# Patient Record
Sex: Female | Born: 1990
Health system: Southern US, Community
[De-identification: ages and names within clinical notes are randomized; demographics above are authoritative.]

## PROBLEM LIST (undated history)

## (undated) ENCOUNTER — Inpatient Hospital Stay (HOSPITAL_COMMUNITY): Payer: Self-pay

## (undated) DIAGNOSIS — N83209 Unspecified ovarian cyst, unspecified side: Secondary | ICD-10-CM

## (undated) DIAGNOSIS — F419 Anxiety disorder, unspecified: Secondary | ICD-10-CM

## (undated) DIAGNOSIS — J45909 Unspecified asthma, uncomplicated: Secondary | ICD-10-CM

## (undated) DIAGNOSIS — F329 Major depressive disorder, single episode, unspecified: Secondary | ICD-10-CM

## (undated) DIAGNOSIS — F32A Depression, unspecified: Secondary | ICD-10-CM

## (undated) HISTORY — DX: Unspecified asthma, uncomplicated: J45.909

## (undated) HISTORY — PX: NO PAST SURGERIES: SHX2092

---

## 2018-02-23 ENCOUNTER — Other Ambulatory Visit: Payer: Self-pay

## 2018-02-23 ENCOUNTER — Encounter (HOSPITAL_BASED_OUTPATIENT_CLINIC_OR_DEPARTMENT_OTHER): Payer: Self-pay | Admitting: *Deleted

## 2018-02-23 ENCOUNTER — Emergency Department (HOSPITAL_BASED_OUTPATIENT_CLINIC_OR_DEPARTMENT_OTHER)
Admission: EM | Admit: 2018-02-23 | Discharge: 2018-02-23 | Disposition: A | Discharge status: Home / Self Care | Payer: Commercial Managed Care - PPO | Attending: Emergency Medicine | Admitting: Emergency Medicine

## 2018-02-23 DIAGNOSIS — N760 Acute vaginitis: Secondary | ICD-10-CM | POA: Insufficient documentation

## 2018-02-23 DIAGNOSIS — B9689 Other specified bacterial agents as the cause of diseases classified elsewhere: Secondary | ICD-10-CM | POA: Diagnosis not present

## 2018-02-23 DIAGNOSIS — R1032 Left lower quadrant pain: Secondary | ICD-10-CM

## 2018-02-23 HISTORY — DX: Unspecified ovarian cyst, unspecified side: N83.209

## 2018-02-23 LAB — URINALYSIS, ROUTINE W REFLEX MICROSCOPIC
BILIRUBIN URINE: NEGATIVE
GLUCOSE, UA: NEGATIVE mg/dL
Hgb urine dipstick: NEGATIVE
Ketones, ur: NEGATIVE mg/dL
LEUKOCYTES UA: NEGATIVE
NITRITE: NEGATIVE
PH: 6.5 (ref 5.0–8.0)
PROTEIN: NEGATIVE mg/dL
Specific Gravity, Urine: 1.025 (ref 1.005–1.030)

## 2018-02-23 LAB — CBC WITH DIFFERENTIAL/PLATELET
BASOS ABS: 0 10*3/uL (ref 0.0–0.1)
BASOS PCT: 1 %
Eosinophils Absolute: 0.3 10*3/uL (ref 0.0–0.7)
Eosinophils Relative: 6 %
HCT: 37.5 % (ref 36.0–46.0)
HEMOGLOBIN: 12.8 g/dL (ref 12.0–15.0)
LYMPHS PCT: 45 %
Lymphs Abs: 2.1 10*3/uL (ref 0.7–4.0)
MCH: 33.3 pg (ref 26.0–34.0)
MCHC: 34.1 g/dL (ref 30.0–36.0)
MCV: 97.7 fL (ref 78.0–100.0)
Monocytes Absolute: 0.5 10*3/uL (ref 0.1–1.0)
Monocytes Relative: 12 %
NEUTROS ABS: 1.7 10*3/uL (ref 1.7–7.7)
NEUTROS PCT: 36 %
Platelets: 255 10*3/uL (ref 150–400)
RBC: 3.84 MIL/uL — AB (ref 3.87–5.11)
RDW: 11.9 % (ref 11.5–15.5)
WBC: 4.6 10*3/uL (ref 4.0–10.5)

## 2018-02-23 LAB — LIPASE, BLOOD: Lipase: 30 U/L (ref 11–51)

## 2018-02-23 LAB — WET PREP, GENITAL
SPERM: NONE SEEN
TRICH WET PREP: NONE SEEN
YEAST WET PREP: NONE SEEN

## 2018-02-23 LAB — COMPREHENSIVE METABOLIC PANEL
ALK PHOS: 54 U/L (ref 38–126)
ALT: 16 U/L (ref 14–54)
AST: 19 U/L (ref 15–41)
Albumin: 4.4 g/dL (ref 3.5–5.0)
Anion gap: 6 (ref 5–15)
BILIRUBIN TOTAL: 0.2 mg/dL — AB (ref 0.3–1.2)
BUN: 11 mg/dL (ref 6–20)
CALCIUM: 9.5 mg/dL (ref 8.9–10.3)
CO2: 26 mmol/L (ref 22–32)
Chloride: 106 mmol/L (ref 101–111)
Creatinine, Ser: 0.6 mg/dL (ref 0.44–1.00)
GFR calc non Af Amer: >60 mL/min (ref >60)
Glucose, Bld: 88 mg/dL (ref 65–99)
POTASSIUM: 4.1 mmol/L (ref 3.5–5.1)
Sodium: 138 mmol/L (ref 135–145)
TOTAL PROTEIN: 6.9 g/dL (ref 6.5–8.1)

## 2018-02-23 LAB — PREGNANCY, URINE: Preg Test, Ur: NEGATIVE

## 2018-02-23 MED ORDER — METRONIDAZOLE 500 MG PO TABS: 500.0000 mg | ORAL_TABLET | Freq: Two times a day (BID) | ORAL | 0 refills | Status: DC

## 2018-02-23 MED ORDER — KETOROLAC TROMETHAMINE 30 MG/ML IJ SOLN
30.0000 mg | Freq: Once | INTRAMUSCULAR | Status: AC
  Administered 2018-02-23: 30 mg via INTRAMUSCULAR
  Filled 2018-02-23: qty 1

## 2018-02-23 MED FILL — metroNIDAZOLE 500 MG TABS: 500 | 7 days supply | Qty: 14 | Fill #0

## 2018-02-23 NOTE — ED Provider Notes (Addendum)
[facility] EMERGENCY DEPARTMENT Provider Note   CSN: [id number] Arrival date & time: 02/23/18  1356     History   Chief Complaint Chief Complaint  Patient presents with  . Flank Pain    HPI Katrina Kennedy is a 27 y.o. female with past medical history ovarian cysts, presenting to the ED with left abdominal pain times 2 days.  Patient states symptoms feel similar to history of ovarian cysts, however pain radiates a little higher into her abdomen than normal.  Reports pain is sharp and worse with particular movement and palpation.  She states she has history of cysts bilaterally.  Takes over-the-counter naproxen for symptoms.  Denies associated urinary symptoms, abnormal vaginal bleeding or discharge, nausea, vomiting, diarrhea, constipation.  No history of abdominal surgery.  States she was last sexually active about 2 months ago without protection.  LMP 02/02/2018.  The history is provided by the patient.    Past Medical History:  Diagnosis Date  . Ovarian cyst     There are no active problems to display for this patient.   History reviewed. No pertinent surgical history.   OB History   None      Home Medications    Prior to Admission medications   Medication Sig Start Date End Date Taking? Authorizing Provider  metroNIDAZOLE (FLAGYL) 500 MG tablet Take 1 tablet (500 mg total) by mouth 2 (two) times daily. 02/23/18   Kaelyn Nauta, [place] N, PA-C    Family History History reviewed. No pertinent family history.  Social History Social History   Tobacco Use  . Smoking status: Never Smoker  . Smokeless tobacco: Never Used  Substance Use Topics  . Alcohol use: Not Currently    Frequency: Never  . Drug use: Never     Allergies   Augmentin [amoxicillin-pot clavulanate]   Review of Systems Review of Systems  Constitutional: Negative for fever.  Gastrointestinal: Positive for abdominal pain. Negative for constipation, diarrhea, nausea and vomiting.    Genitourinary: Negative for dysuria, flank pain, frequency, hematuria, vaginal bleeding and vaginal discharge.  All other systems reviewed and are negative.    Physical Exam Updated Vital Signs BP (!) 110/59   Pulse 73   Temp 97.7 F (36.5 C)   Resp 18   Ht 5\' 3"  (1.6 m)   Wt 56.2 kg (124 lb)   LMP 02/02/2018   SpO2 100%   BMI 21.97 kg/m   Physical Exam  Constitutional: She appears well-developed and well-nourished. No distress.  HENT:  Head: Normocephalic and atraumatic.  Mouth/Throat: Oropharynx is clear and moist.  Eyes: Conjunctivae are normal.  Cardiovascular: Normal rate, regular rhythm, normal heart sounds and intact distal pulses.  Pulmonary/Chest: Effort normal and breath sounds normal. No respiratory distress.  Abdominal: Soft. Bowel sounds are normal. She exhibits no distension and no mass. There is tenderness in the suprapubic area, left upper quadrant and left lower quadrant. There is no rigidity, no rebound, no guarding and no CVA tenderness. No hernia.  Genitourinary: There is no rash or tenderness on the right labia. There is no rash or tenderness on the left labia. Cervix exhibits discharge. Cervix exhibits no motion tenderness. Right adnexum displays no mass, no tenderness and no fullness. Left adnexum displays no mass, no tenderness and no fullness. There is erythema in the vagina. No tenderness or bleeding in the vagina.  Genitourinary Comments: Exam performed with female chaperone present. White malodorous discharge with some vaginal erythema  Neurological: She is alert.  Skin: Skin  is warm.  Psychiatric: She has a normal mood and affect. Her behavior is normal.  Nursing note and vitals reviewed.    ED Treatments / Results  Labs (all labs ordered are listed, but only abnormal results are displayed) Labs Reviewed  WET PREP, GENITAL - Abnormal; Notable for the following components:      Result Value   Clue Cells Wet Prep HPF POC PRESENT (*)    WBC, Wet  Prep HPF POC MODERATE (*)    All other components within normal limits  URINALYSIS, ROUTINE W REFLEX MICROSCOPIC - Abnormal; Notable for the following components:   APPearance HAZY (*)    All other components within normal limits  COMPREHENSIVE METABOLIC PANEL - Abnormal; Notable for the following components:   Total Bilirubin 0.2 (*)    All other components within normal limits  CBC WITH DIFFERENTIAL/PLATELET - Abnormal; Notable for the following components:   RBC 3.84 (*)    All other components within normal limits  PREGNANCY, URINE  LIPASE, BLOOD  HIV ANTIBODY (ROUTINE TESTING)  RPR  GC/CHLAMYDIA PROBE AMP ([place]    EKG None  Radiology No results found.  Procedures Procedures (including critical care time)  Medications Ordered in ED Medications  ketorolac (TORADOL) 30 MG/ML injection 30 mg (has no administration in time range)     Initial Impression / Assessment and Plan / ED Course  I have reviewed the triage vital signs and the nursing notes.  Pertinent labs & imaging results that were available during my care of the patient were reviewed by me and considered in my medical decision making (see chart for details).     Pt with hx of bilateral ovarian cysts, presenting with similar sx x2 days. Abd with left-sided tenderness, no guarding or rebound. Pelvic exam with discharge; no adnexal tenderness or CMT. Wet prep consistent with BV. CBC without leukocytosis, CMP wnl, preg neg, urine neg. Ultrasound at this ED is currently not operational; discussed likelihood of BV as cause of discomfort w/ possible cyst. Low suspicion for torsion or other acute intraabdominal pathology. Option provided to treat BV with strict return precautions vs transfer to another ED for ultrasound. Pt decided to treat with abx and return as needed. Pt is not in distress, afebrile, well-appearing. Will send with rx for flagyl, pt aware of pending STD cultures. Strict return  precautions discussed. Safe for discharge.  Patient discussed with Dr. Corlis LeakMacKuen who agrees with care plan.  Discussed results, findings, treatment and follow up. Patient advised of return precautions. Patient verbalized understanding and agreed with plan.   Final Clinical Impressions(s) / ED Diagnoses   Final diagnoses:  Bacterial vaginosis  Abdominal pain, LLQ (left lower quadrant)    ED Discharge Orders        Ordered    metroNIDAZOLE (FLAGYL) 500 MG tablet  2 times daily     02/23/18 1555       Marvina Danner, [place] N, PA-C 02/23/18 1556    Mickenzie Stolar, [place] N, [place]C 02/23/18 1556    Alvira MondaySchlossman, Erin, MD 02/25/18 2233

## 2018-02-23 NOTE — Discharge Instructions (Addendum)
Please read the instructions below. Talk with your primary care provider about any new medications. Please schedule an appointment for follow up with your OBGYN or primary care. Finish your antibiotic (Flagyl/Metronidazole) as prescribed. Do not drink alcohol with this medication as it will cause vomiting. You will receive a call from the hospital if your test results come back positive. Avoid sexual activity until you know your test results. If your results come back positive, it is important that you inform all of your sexual partners. Return to the ER for worsening abdominal pain, fever, or new or concerning symptoms.

## 2018-02-23 NOTE — ED Notes (Signed)
Pelvic Cart at bedside 

## 2018-02-23 NOTE — ED Triage Notes (Signed)
pt c/o left flank pain x 2 days

## 2018-02-24 LAB — RPR: RPR Ser Ql: NONREACTIVE

## 2018-02-24 LAB — HIV ANTIBODY (ROUTINE TESTING W REFLEX): HIV SCREEN 4TH GENERATION: NONREACTIVE

## 2018-02-24 LAB — GC/CHLAMYDIA PROBE AMP (~~LOC~~) NOT AT ARMC
Chlamydia: NEGATIVE
NEISSERIA GONORRHEA: NEGATIVE

## 2018-12-24 ENCOUNTER — Inpatient Hospital Stay (HOSPITAL_COMMUNITY): Payer: Medicaid Other

## 2018-12-24 ENCOUNTER — Inpatient Hospital Stay (HOSPITAL_COMMUNITY)
Admission: AD | Admit: 2018-12-24 | Discharge: 2018-12-24 | Disposition: A | Discharge status: Home / Self Care | Payer: Medicaid Other | Attending: Obstetrics & Gynecology | Admitting: Obstetrics & Gynecology

## 2018-12-24 ENCOUNTER — Encounter (HOSPITAL_COMMUNITY): Payer: Self-pay | Admitting: *Deleted

## 2018-12-24 DIAGNOSIS — O26899 Other specified pregnancy related conditions, unspecified trimester: Secondary | ICD-10-CM

## 2018-12-24 DIAGNOSIS — O26891 Other specified pregnancy related conditions, first trimester: Secondary | ICD-10-CM

## 2018-12-24 DIAGNOSIS — R1030 Lower abdominal pain, unspecified: Secondary | ICD-10-CM | POA: Diagnosis present

## 2018-12-24 DIAGNOSIS — Z3A01 Less than 8 weeks gestation of pregnancy: Secondary | ICD-10-CM | POA: Diagnosis not present

## 2018-12-24 DIAGNOSIS — O3680X0 Pregnancy with inconclusive fetal viability, not applicable or unspecified: Secondary | ICD-10-CM

## 2018-12-24 DIAGNOSIS — R109 Unspecified abdominal pain: Secondary | ICD-10-CM

## 2018-12-24 DIAGNOSIS — Z3A11 11 weeks gestation of pregnancy: Secondary | ICD-10-CM

## 2018-12-24 DIAGNOSIS — Z880 Allergy status to penicillin: Secondary | ICD-10-CM | POA: Diagnosis not present

## 2018-12-24 HISTORY — DX: Anxiety disorder, unspecified: F41.9

## 2018-12-24 HISTORY — DX: Major depressive disorder, single episode, unspecified: F32.9

## 2018-12-24 HISTORY — DX: Depression, unspecified: F32.A

## 2018-12-24 LAB — CBC
HCT: 39.1 % (ref 36.0–46.0)
HEMOGLOBIN: 12.7 g/dL (ref 12.0–15.0)
MCH: 32.3 pg (ref 26.0–34.0)
MCHC: 32.5 g/dL (ref 30.0–36.0)
MCV: 99.5 fL (ref 80.0–100.0)
Platelets: 258 10*3/uL (ref 150–400)
RBC: 3.93 MIL/uL (ref 3.87–5.11)
RDW: 12 % (ref 11.5–15.5)
WBC: 4.1 10*3/uL (ref 4.0–10.5)
nRBC: 0 % (ref 0.0–0.2)

## 2018-12-24 LAB — URINALYSIS, MICROSCOPIC (REFLEX)

## 2018-12-24 LAB — URINALYSIS, ROUTINE W REFLEX MICROSCOPIC
BILIRUBIN URINE: NEGATIVE
Glucose, UA: NEGATIVE mg/dL
Hgb urine dipstick: NEGATIVE
KETONES UR: NEGATIVE mg/dL
Nitrite: NEGATIVE
PH: 6 (ref 5.0–8.0)
Protein, ur: NEGATIVE mg/dL

## 2018-12-24 LAB — WET PREP, GENITAL
SPERM: NONE SEEN
Trich, Wet Prep: NONE SEEN
Yeast Wet Prep HPF POC: NONE SEEN

## 2018-12-24 LAB — HCG, QUANTITATIVE, PREGNANCY: hCG, Beta Chain, Quant, S: 157 m[IU]/mL — ABNORMAL HIGH (ref <5)

## 2018-12-24 LAB — POCT PREGNANCY, URINE: Preg Test, Ur: POSITIVE — AB

## 2018-12-24 NOTE — MAU Note (Addendum)
I passed out earlier today for about 2 mins.  Did home upt this wk and was positive. Having some sharp abd cramping off and on. Had vag spotting 2wks ago and none since. LMP 10/12/18 but have irreg periods. Pt unable to void currently. Has cup and will get urine when she can

## 2018-12-24 NOTE — Progress Notes (Signed)
J Rasch NP in to see pt and discuss test results and d/c plan. Written and verbal d/c instructions given and understanding voiced

## 2018-12-24 NOTE — Discharge Instructions (Signed)
Activity Restriction During Pregnancy °Your health care provider may recommend specific activity restrictions during pregnancy for a variety of reasons. Activity restriction may require that you limit activities that require great effort, such as exercise, lifting, or sex. °The type of activity restriction will vary for each person, depending on your risk or the problems you are having. Activity restriction may be recommended for a period of time until your baby is delivered. °Why are activity restrictions recommended? °Activity restriction may be recommended if: °· Your placenta is partially or completely covering the opening of your cervix (placenta previa). °· There is bleeding between the wall of the uterus and the amniotic sac in the first trimester of pregnancy (subchorionic hemorrhage). °· You went into labor too early (preterm labor). °· You have a history of miscarriage. °· You have a condition that causes high blood pressure during pregnancy (preeclampsia or eclampsia). °· You are pregnant with more than one baby. °· Your baby is not growing well. °What are the risks? °The risks depend on your specific restriction. Strict bed rest has the most physical and emotional risks and is no longer routinely recommended. Risks of strict bed rest include: °· Loss of muscle conditioning from not moving. °· Blood clots. °· Social isolation. °· Depression. °· Loss of income. °Talk with your health care team about activity restriction to decide if it is best for you and your baby. Even if you are having problems during your pregnancy, you may be able to continue with normal levels of activity with careful monitoring by your health care team. °Follow these instructions at home: °If needed, based on your overall health and the health of your baby, your health care provider will decide which type of activity restriction is right for you. Activity restrictions may include: °· Not lifting anything heavier than 10 pounds (4.5  kg). °· Avoiding activities that take a lot of physical effort. °· No lifting or straining. °· Resting in a sitting position or lying down for periods of time during the day. °Pelvic rest may be recommended along with activity restrictions. If pelvic rest is recommended, then: °· Do not have sex, an orgasm, or use sexual stimulation. °· Do not use tampons. Do not douche. Do not put anything into your vagina. °· Do not lift anything that is heavier than 10 lb (4.5 kg). °· Avoid activities that require a lot of effort. °· Avoid any activity in which your pelvic muscles could become strained, such as squatting. °Questions to ask your health care provider °· Why is my activity being limited? °· How will activity restrictions affect my body? °· Why is rest helpful for me and my baby? °· What activities can I do? °· When can I return to normal activities? °When should I seek immediate medical care? °Seek immediate medical care if you have: °· Vaginal bleeding. °· Vaginal discharge. °· Cramping pain in your lower abdomen. °· Regular contractions. °· A low, dull backache. °Summary °· Your health care provider may recommend specific activity restrictions during pregnancy for a variety of reasons. °· Activity restriction may require that you limit activities such as exercise, lifting, sex, or any other activity that requires great effort. °· Discuss the risks and benefits of activity restriction with your health care team to decide if it is best for you and your baby. °· Contact your health care provider right away if you think you are having contractions, or if you notice vaginal bleeding, discharge, or cramping. °This information is not   intended to replace advice given to you by your health care provider. Make sure you discuss any questions you have with your health care provider. °Document Released: 02/13/2011 Document Revised: 02/08/2018 Document Reviewed: 02/08/2018 °Elsevier Interactive Patient Education © 2019 [employer]vier  Inc. ° °

## 2018-12-24 NOTE — MAU Provider Note (Signed)
History     CSN: [id number]  Arrival date and time: 12/24/18 2013   First Provider Initiated Contact with Patient 12/24/18 2223      No chief complaint on file.  HPI   Ms.Katrina Kennedy is a 28 y.o. female G16P1011 @ Unknown here with lower abdominal pain. The pain waxes and wanes from unilaterial to bilateral. The pain has been present for 1-2 weeks. She had spotting last week however none since then. When the pain comes the pain is very strong and she has to stop what she is dong when it hits.  Says she became dizzy today and nearly passed out. Does not feel she is eating and drinking like normal.   + pregnancy test 2 days ago.   OB History    Gravida  3   Para  1   Term  1   Preterm      AB  1   Living  1     SAB  1   TAB      Ectopic      Multiple      Live Births  1           Past Medical History:  Diagnosis Date  . Anxiety   . Depression   . Ovarian cyst     Past Surgical History:  Procedure Laterality Date  . NO PAST SURGERIES      History reviewed. No pertinent family history.  Social History   Tobacco Use  . Smoking status: Never Smoker  . Smokeless tobacco: Never Used  Substance Use Topics  . Alcohol use: Not Currently    Frequency: Never  . Drug use: Never    Allergies:  Allergies  Allergen Reactions  . Augmentin [Amoxicillin-Pot Clavulanate] Hives    Medications Prior to Admission  Medication Sig Dispense Refill Last Dose  . metroNIDAZOLE (FLAGYL) 500 MG tablet Take 1 tablet (500 mg total) by mouth 2 (two) times daily. 14 tablet 0    Results for orders placed or performed during the hospital encounter of 12/24/18 (from the past 48 hour(s))  Urinalysis, Routine w reflex microscopic     Status: Abnormal   Collection Time: 12/24/18  8:49 PM  Result Value Ref Range   Color, Urine YELLOW YELLOW   APPearance CLEAR CLEAR   Specific Gravity, Urine >1.030 (H) 1.005 - 1.030   pH 6.0 5.0 - 8.0   Glucose, UA NEGATIVE NEGATIVE  mg/dL   Hgb urine dipstick NEGATIVE NEGATIVE   Bilirubin Urine NEGATIVE NEGATIVE   Ketones, ur NEGATIVE NEGATIVE mg/dL   Protein, ur NEGATIVE NEGATIVE mg/dL   Nitrite NEGATIVE NEGATIVE   Leukocytes,Ua TRACE (A) NEGATIVE    Comment: Performed at [facility], [address] [place], [place] [address]  Urinalysis, Microscopic (reflex)     Status: Abnormal   Collection Time: 12/24/18  8:49 PM  Result Value Ref Range   RBC / HPF 0-5 0 - 5 RBC/hpf   WBC, UA 0-5 0 - 5 WBC/hpf   Bacteria, UA FEW (A) NONE SEEN   Squamous Epithelial / LPF 0-5 0 - 5    Comment: Performed at [facility], [address], [place], [place] [address]  Pregnancy, urine POC     Status: Abnormal   Collection Time: 12/24/18  8:52 PM  Result Value Ref Range   Preg Test, Ur POSITIVE (A) NEGATIVE    Comment:        THE SENSITIVITY OF THIS METHODOLOGY IS >24 mIU/mL  CBC     Status: None   Collection Time: 12/24/18  9:29 PM  Result Value Ref Range   WBC 4.1 4.0 - 10.5 K/uL   RBC 3.93 3.87 - 5.11 MIL/uL   Hemoglobin 12.7 12.0 - 15.0 g/dL   HCT 97.9 89.2 - 11.9 %   MCV 99.5 80.0 - 100.0 fL   MCH 32.3 26.0 - 34.0 pg   MCHC 32.5 30.0 - 36.0 g/dL   RDW 41.7 40.8 - 14.4 %   Platelets 258 150 - 400 K/uL   nRBC 0.0 0.0 - 0.2 %    Comment: Performed at [facility], [address], [place], [place] [address]  hCG, quantitative, pregnancy     Status: Abnormal   Collection Time: 12/24/18  9:29 PM  Result Value Ref Range   hCG, Beta Chain, Quant, S 157 (H) <5 mIU/mL    Comment:          GEST. AGE      CONC.  (mIU/mL)   <=1 WEEK        5 - 50     2 WEEKS       50 - 500     3 WEEKS       100 - 10,000     4 WEEKS     1,000 - 30,000     5 WEEKS     3,500 - 115,000   6-8 WEEKS     12,000 - 270,000    12 WEEKS     15,000 - 220,000        FEMALE AND NON-PREGNANT FEMALE:     LESS THAN 5 mIU/mL Performed at [facility], [address], [place], [place] [address]   Wet prep, genital     Status: Abnormal    Collection Time: 12/24/18  9:29 PM  Result Value Ref Range   Yeast Wet Prep HPF POC NONE SEEN NONE SEEN   Trich, Wet Prep NONE SEEN NONE SEEN   Clue Cells Wet Prep HPF POC PRESENT (A) NONE SEEN   WBC, Wet Prep HPF POC MODERATE (A) NONE SEEN    Comment: MODERATE BACTERIA SEEN   Sperm NONE SEEN     Comment: Performed at [facility], [address], [place], [place] [address]  ABO/Rh     Status: None   Collection Time: 12/24/18  9:29 PM  Result Value Ref Range   ABO/RH(D)      O POS Performed at [facility], [address], [place], [place] [address]    US Ob Less Than 14 Weeks With Ob Transvaginal  Result Date: 12/24/2018 CLINICAL DATA:  28 y/o F; abdominal pain and spotting 2 weeks ago. Quantitative beta HCG 157. EXAM: OBSTETRIC <14 WK [place] AND TRANSVAGINAL OB US TECHNIQUE: Both transabdominal and transvaginal ultrasound examinations were performed for complete evaluation of the gestation as well as the maternal uterus, adnexal regions, and pelvic cul-de-sac. Transvaginal technique was performed to assess early pregnancy. COMPARISON:  None. FINDINGS: Intrauterine gestational sac: None Yolk sac:  Not Visualized. Embryo:  Not Visualized. Cardiac Activity: Not Visualized. Subchorionic hemorrhage:  None visualized. Maternal uterus/adnexae: Normal appearance of the bilateral ovaries in the uterus. No adnexal mass. Right ovary corpus luteum cyst. IMPRESSION: Pregnancy of unknown location or recent spontaneous abortion. Intrauterine pregnancy may not be identified with a beta HCG of less than 3,000. Follow-up beta HCG and possible pelvic ultrasound as indicated is recommended to evaluate pregnancy location. This recommendation follows SRU consensus guidelines: Diagnostic Criteria  for Nonviable Pregnancy Early in the First Trimester. Malva Limes Med 2013; 175:1025-85. Electronically Signed   By: Mitzi Hansen M.D.   On: 12/24/2018 23:23   Review of Systems  Gastrointestinal: Positive  for abdominal pain.  Genitourinary: Negative for vaginal bleeding.   Physical Exam   Blood pressure 121/60, pulse 64, temperature 98 F (36.7 C), resp. rate 18, height 5\' 3"  (1.6 m), weight 55.8 kg, last menstrual period 10/12/2018.  Physical Exam  Constitutional: She is oriented to person, place, and time. She appears well-developed and well-nourished. No distress.  HENT:  Head: Normocephalic.  GI: Soft. She exhibits no distension and no mass. There is no abdominal tenderness. There is no rebound and no guarding.  Musculoskeletal: Normal range of motion.  Neurological: She is alert and oriented to person, place, and time.  Skin: Skin is warm. She is not diaphoretic.  Psychiatric: Her behavior is normal.    MAU Course  Procedures  None  MDM  O positive blood type.  Wet prep & GC HIV, CBC, Hcg, ABO US OB transvaginal   Assessment and Plan   A:  1. Pregnancy of unknown anatomic location   2. Abdominal pain in pregnancy   3. Abdominal pain during pregnancy, antepartum     P:  Discharge home in stable condition  Return to MAU if symptoms worsen Return to the [facility] office clinic on Tuesday at 11:00 am for repeat blood work Ectopic precautions Pelvic rest   Sharen Youngren, Harolyn Rutherford, NP 12/25/2018 11:26 AM

## 2018-12-25 LAB — ABO/RH: ABO/RH(D): O POS

## 2018-12-25 LAB — HIV ANTIBODY (ROUTINE TESTING W REFLEX): HIV Screen 4th Generation wRfx: NONREACTIVE

## 2018-12-26 LAB — GC/CHLAMYDIA PROBE AMP (~~LOC~~) NOT AT ARMC
Chlamydia: NEGATIVE
Neisseria Gonorrhea: NEGATIVE

## 2018-12-27 ENCOUNTER — Other Ambulatory Visit: Payer: Non-veteran care

## 2019-01-05 ENCOUNTER — Other Ambulatory Visit: Payer: Self-pay | Admitting: Internal Medicine

## 2019-01-05 ENCOUNTER — Other Ambulatory Visit (HOSPITAL_COMMUNITY): Payer: Self-pay | Admitting: Internal Medicine

## 2019-01-05 DIAGNOSIS — Z3A01 Less than 8 weeks gestation of pregnancy: Secondary | ICD-10-CM

## 2019-01-06 ENCOUNTER — Ambulatory Visit (HOSPITAL_COMMUNITY)
Admission: RE | Admit: 2019-01-06 | Discharge: 2019-01-06 | Disposition: A | Discharge status: Home / Self Care | Payer: Medicaid Other | Source: Ambulatory Visit | Attending: Internal Medicine | Admitting: Internal Medicine

## 2019-01-06 DIAGNOSIS — Z3A01 Less than 8 weeks gestation of pregnancy: Secondary | ICD-10-CM | POA: Insufficient documentation

## 2019-02-13 ENCOUNTER — Encounter: Payer: Non-veteran care | Admitting: Obstetrics and Gynecology

## 2019-03-29 DIAGNOSIS — Z3480 Encounter for supervision of other normal pregnancy, unspecified trimester: Secondary | ICD-10-CM | POA: Insufficient documentation

## 2019-03-30 ENCOUNTER — Encounter: Payer: Self-pay | Admitting: Certified Nurse Midwife

## 2019-03-30 ENCOUNTER — Other Ambulatory Visit: Payer: Self-pay

## 2019-03-30 ENCOUNTER — Other Ambulatory Visit (HOSPITAL_COMMUNITY)
Admission: RE | Admit: 2019-03-30 | Discharge: 2019-03-30 | Disposition: A | Discharge status: Home / Self Care | Payer: Medicaid Other | Source: Ambulatory Visit | Attending: Certified Nurse Midwife | Admitting: Certified Nurse Midwife

## 2019-03-30 ENCOUNTER — Ambulatory Visit (INDEPENDENT_AMBULATORY_CARE_PROVIDER_SITE_OTHER): Payer: Medicaid Other | Admitting: Certified Nurse Midwife

## 2019-03-30 VITALS — BP 110/65 | HR 69 | Temp 98.6°F | Wt 127.2 lb

## 2019-03-30 DIAGNOSIS — Z362 Encounter for other antenatal screening follow-up: Secondary | ICD-10-CM | POA: Diagnosis not present

## 2019-03-30 DIAGNOSIS — Z3481 Encounter for supervision of other normal pregnancy, first trimester: Secondary | ICD-10-CM

## 2019-03-30 DIAGNOSIS — O219 Vomiting of pregnancy, unspecified: Secondary | ICD-10-CM

## 2019-03-30 DIAGNOSIS — N760 Acute vaginitis: Secondary | ICD-10-CM

## 2019-03-30 DIAGNOSIS — Z8659 Personal history of other mental and behavioral disorders: Secondary | ICD-10-CM | POA: Insufficient documentation

## 2019-03-30 DIAGNOSIS — B3731 Acute candidiasis of vulva and vagina: Secondary | ICD-10-CM

## 2019-03-30 DIAGNOSIS — Z3480 Encounter for supervision of other normal pregnancy, unspecified trimester: Secondary | ICD-10-CM

## 2019-03-30 DIAGNOSIS — O23591 Infection of other part of genital tract in pregnancy, first trimester: Secondary | ICD-10-CM

## 2019-03-30 DIAGNOSIS — B9689 Other specified bacterial agents as the cause of diseases classified elsewhere: Secondary | ICD-10-CM

## 2019-03-30 DIAGNOSIS — B373 Candidiasis of vulva and vagina: Secondary | ICD-10-CM

## 2019-03-30 DIAGNOSIS — Z3A08 8 weeks gestation of pregnancy: Secondary | ICD-10-CM

## 2019-03-30 MED ORDER — BLOOD PRESSURE MONITORING KIT: 1.0000 | PACK | 0 refills | Status: AC

## 2019-03-30 MED ORDER — PROMETHAZINE HCL 12.5 MG PO TABS: 12.5000 mg | ORAL_TABLET | Freq: Four times a day (QID) | ORAL | 1 refills | Status: AC | PRN

## 2019-03-30 NOTE — Patient Instructions (Signed)
First Trimester of Pregnancy  The first trimester of pregnancy is from week 1 until the end of week 13 (months 1 through 3). During this time, your baby will begin to develop inside you. At 6-8 weeks, the eyes and face are formed, and the heartbeat can be seen on ultrasound. At the end of 12 weeks, all the baby's organs are formed. Prenatal care is all the medical care you receive before the birth of your baby. Make sure you get good prenatal care and follow all of your doctor's instructions. Follow these instructions at home: Medicines  Take over-the-counter and prescription medicines only as told by your doctor. Some medicines are safe and some medicines are not safe during pregnancy.  Take a prenatal vitamin that contains at least 600 micrograms (mcg) of folic acid.  If you have trouble pooping (constipation), take medicine that will make your stool soft (stool softener) if your doctor approves. Eating and drinking   Eat regular, healthy meals.  Your doctor will tell you the amount of weight gain that is right for you.  Avoid raw meat and uncooked cheese.  If you feel sick to your stomach (nauseous) or throw up (vomit): ? Eat 4 or 5 small meals a day instead of 3 large meals. ? Try eating a few soda crackers. ? Drink liquids between meals instead of during meals.  To prevent constipation: ? Eat foods that are high in fiber, like fresh fruits and vegetables, whole grains, and beans. ? Drink enough fluids to keep your pee (urine) clear or pale yellow. Activity  Exercise only as told by your doctor. Stop exercising if you have cramps or pain in your lower belly (abdomen) or low back.  Do not exercise if it is too hot, too humid, or if you are in a place of great height (high altitude).  Try to avoid standing for long periods of time. Move your legs often if you must stand in one place for a long time.  Avoid heavy lifting.  Wear low-heeled shoes. Sit and stand up straight.   You can have sex unless your doctor tells you not to. Relieving pain and discomfort  Wear a good support bra if your breasts are sore.  Take warm water baths (sitz baths) to soothe pain or discomfort caused by hemorrhoids. Use hemorrhoid cream if your doctor says it is okay.  Rest with your legs raised if you have leg cramps or low back pain.  If you have puffy, bulging veins (varicose veins) in your legs: ? Wear support hose or compression stockings as told by your doctor. ? Raise (elevate) your feet for 15 minutes, 3-4 times a day. ? Limit salt in your food. Prenatal care  Schedule your prenatal visits by the twelfth week of pregnancy.  Write down your questions. Take them to your prenatal visits.  Keep all your prenatal visits as told by your doctor. This is important. Safety  Wear your seat belt at all times when driving.  Make a list of emergency phone numbers. The list should include numbers for family, friends, the hospital, and police and fire departments. General instructions  Ask your doctor for a referral to a local prenatal class. Begin classes no later than at the start of month 6 of your pregnancy.  Ask for help if you need counseling or if you need help with nutrition. Your doctor can give you advice or tell you where to go for help.  Do not use hot tubs, steam   rooms, or saunas.  Do not douche or use tampons or scented sanitary pads.  Do not cross your legs for long periods of time.  Avoid all herbs and alcohol. Avoid drugs that are not approved by your doctor.  Do not use any tobacco products, including cigarettes, chewing tobacco, and electronic cigarettes. If you need help quitting, ask your doctor. You may get counseling or other support to help you quit.  Avoid cat litter boxes and soil used by cats. These carry germs that can cause birth defects in the baby and can cause a loss of your baby (miscarriage) or stillbirth.  Visit your dentist. At home,  brush your teeth with a soft toothbrush. Be gentle when you floss. Contact a doctor if:  You are dizzy.  You have mild cramps or pressure in your lower belly.  You have a nagging pain in your belly area.  You continue to feel sick to your stomach, you throw up, or you have watery poop (diarrhea).  You have a bad smelling fluid coming from your vagina.  You have pain when you pee (urinate).  You have increased puffiness (swelling) in your face, hands, legs, or ankles. Get help right away if:  You have a fever.  You are leaking fluid from your vagina.  You have spotting or bleeding from your vagina.  You have very bad belly cramping or pain.  You gain or lose weight rapidly.  You throw up blood. It may look like coffee grounds.  You are around people who have German measles, fifth disease, or chickenpox.  You have a very bad headache.  You have shortness of breath.  You have any kind of trauma, such as from a fall or a car accident. Summary  The first trimester of pregnancy is from week 1 until the end of week 13 (months 1 through 3).  To take care of yourself and your unborn baby, you will need to eat healthy meals, take medicines only if your doctor tells you to do so, and do activities that are safe for you and your baby.  Keep all follow-up visits as told by your doctor. This is important as your doctor will have to ensure that your baby is healthy and growing well. This information is not intended to replace advice given to you by your health care provider. Make sure you discuss any questions you have with your health care provider. Document Released: 04/06/2008 Document Revised: 10/27/2016 Document Reviewed: 10/27/2016 Elsevier Interactive Patient Education  2019 [employer]  Safe Medications in Pregnancy   Acne: Benzoyl Peroxide Salicylic Acid  Backache/Headache: Tylenol: 2 regular strength every 4 hours OR              2 Extra strength every 6 hours   Colds/Coughs/Allergies: Benadryl (alcohol free) 25 mg every 6 hours as needed Breath right strips Claritin Cepacol throat lozenges Chloraseptic throat spray Cold-Eeze- up to three times per day Cough drops, alcohol free Flonase (by prescription only) Guaifenesin Mucinex Robitussin DM (plain only, alcohol free) Saline nasal spray/drops Sudafed (pseudoephedrine) & Actifed ** use only after [redacted] weeks gestation and if you do not have high blood pressure Tylenol Vicks Vaporub Zinc lozenges Zyrtec   Constipation: Colace Ducolax suppositories Fleet enema Glycerin suppositories Metamucil Milk of magnesia Miralax Senokot Smooth move tea  Diarrhea: Kaopectate Imodium A-D  *NO pepto Bismol  Hemorrhoids: Anusol Anusol HC Preparation H Tucks  Indigestion: Tums Maalox Mylanta Zantac  Pepcid  Insomnia: Benadryl (alcohol free) 25mg every   6 hours as needed Tylenol PM Unisom, no Gelcaps  Leg Cramps: Tums MagGel  Nausea/Vomiting:  Bonine Dramamine Emetrol Ginger extract Sea bands Meclizine  Nausea medication to take during pregnancy:  Unisom (doxylamine succinate 25 mg tablets) Take one tablet daily at bedtime. If symptoms are not adequately controlled, the dose can be increased to a maximum recommended dose of two tablets daily (1/2 tablet in the morning, 1/2 tablet mid-afternoon and one at bedtime). Vitamin B6 100mg  tablets. Take one tablet twice a day (up to 200 mg per day).  Skin Rashes: Aveeno products Benadryl cream or 25mg  every 6 hours as needed Calamine Lotion 1% cortisone cream  Yeast infection: Gyne-lotrimin 7 Monistat 7   **If taking multiple medications, please check labels to avoid duplicating the same active ingredients **take medication as directed on the label ** Do not exceed 4000 mg of tylenol in 24 hours **Do not take medications that contain aspirin or ibuprofen

## 2019-03-30 NOTE — Addendum Note (Signed)
Addended by: Sharyon Cable on: 03/30/2019 02:19 PM   Modules accepted: Orders

## 2019-03-30 NOTE — Progress Notes (Signed)
NOB.  C/o NV and pain 8/10 x 2 weeks.

## 2019-03-30 NOTE — Progress Notes (Signed)
History:   Katrina Kennedy is a 28 y.o. G3P1011 at 6557w3d by LMP being seen today for her first obstetrical visit.  Her obstetrical history is significant for recent miscarriage in March 2020. Patient does intend to breast feed. Pregnancy history fully reviewed.  Patient reports nausea and vomiting.   Patient reports finding out she was pregnancy in February 2020, on 01/06/19 was diagnosed with a miscarriage and was followed by the [facility] with serial HCG levels (reports levels continued to decline through March).  Had normal period end of March and 1st week of April had a positive pregnancy test with 1 day of spotting, has not went to [facility] since positive pregnancy test and reports never having a follow up US since 01/06/2019.  Reports occasional cramping and sharp pain over the past 2 weeks.    HISTORY: OB History  Gravida Para Term Preterm AB Living  3 1 1  0 1 1  SAB TAB Ectopic Multiple Live Births  1 0 0 0 1    # Outcome Date GA Lbr Len/2nd Weight Sex Delivery Anes PTL Lv  3 Current           2 SAB 12/14/17          1 Term 06/15/14    M Vag-Spont   LIV    Last pap smear was done 2019 and was normal per patient. Patient reports being unsure if it was in 2019 or prior to that year. No records on file.   Past Medical History:  Diagnosis Date  . Anxiety   . Asthma   . Depression   . Ovarian cyst    Past Surgical History:  Procedure Laterality Date  . NO PAST SURGERIES     Family History  Problem Relation Age of Onset  . Alcohol abuse Mother   . Drug abuse Mother   . ADD / ADHD Brother   . Cancer Maternal Grandmother   . Hypertension Maternal Grandmother    Social History   Tobacco Use  . Smoking status: Never Smoker  . Smokeless tobacco: Never Used  Substance Use Topics  . Alcohol use: Not Currently    Frequency: Never  . Drug use: Never   Allergies  Allergen Reactions  . Augmentin [Amoxicillin-Pot Clavulanate] Hives   Current Outpatient Medications on File Prior to  Visit  Medication Sig Dispense Refill  . Prenatal Vit-Fe Fumarate-FA (MULTIVITAMIN-PRENATAL) 27-0.8 MG TABS tablet Take 1 tablet by mouth daily at 12 noon.    . metroNIDAZOLE (FLAGYL) 500 MG tablet Take 1 tablet (500 mg total) by mouth 2 (two) times daily. (Patient not taking: Reported on 03/30/2019) 14 tablet 0   No current facility-administered medications on file prior to visit.     Review of Systems Pertinent items noted in HPI and remainder of comprehensive ROS otherwise negative. Physical Exam:   Vitals:   03/30/19 0854  BP: 110/65  Pulse: 69  Temp: 98.6 F (37 C)  Weight: 127 lb 3.2 oz (57.7 kg)    Uterus: bimanual examination palpates a 8 week uterus below the suprapubic bone  Pelvic Exam: Perineum: no hemorrhoids, normal perineum   Vulva: normal external genitalia, no lesions   Vagina:  normal mucosa, moderate amount of white thin milky discharge without odor    Cervix: no lesions and normal, pap smear done.    Adnexa: normal adnexa and no mass, fullness, tenderness   Bony Pelvis: average  System: General: well-developed, well-nourished female in no acute distress   Breasts:  normal appearance, no masses or tenderness bilaterally   Skin: normal coloration and turgor, no rashes   Neurologic: oriented, normal, negative, normal mood   Extremities: normal strength, tone, and muscle mass, ROM of all joints is normal   HEENT PERRLA, extraocular movement intact and sclera clear, anicteric   Mouth/Teeth mucous membranes moist, pharynx normal without lesions and dental hygiene good   Neck supple and no masses   Cardiovascular: regular rate and rhythm   Respiratory:  no respiratory distress, normal breath sounds   Abdomen: soft, non-tender; bowel sounds normal; no masses,  no organomegaly  Bedside Ultrasound for FHR check: Patient informed that the ultrasound is considered a limited obstetric ultrasound and is not intended to be a complete ultrasound exam.  Patient also informed  that the ultrasound is not being completed with the intent of assessing for fetal or placental anomalies or any pelvic abnormalities.  Explained that the purpose of today's ultrasound is to assess for fetal heart rate.  Patient acknowledges the purpose of the exam and the limitations of the study.    FHR 130 by bedside US, visual measurements consistent with a 8-9 week fetus, official dating [place] ordered due to recent miscarriage.    Assessment:    Pregnancy: G3P1011 Patient Active Problem List   Diagnosis Date Noted  . Hx of anxiety disorder 03/30/2019  . Supervision of other normal pregnancy, antepartum 03/29/2019     Plan:    1. Supervision of other normal pregnancy, antepartum - Welcomed to practice and introduced self to patient, discussed nature of practice with Midwives, Fellows and Attendings in office and hospital  - Genetic screening held due to unsure dating  - Anticipatory guidance on upcoming appointments  - Discussed with patient need of dating [place] based on hx and pregnancy immediately after miscarriage, [place] scheduled for 04/10/2019 - Routine prenatal care - medical and obstetrical history reviewed  - Cytology - PAP( [place]) - Babyscripts Schedule Optimization - Obstetric Panel, Including HIV - Culture, OB Urine - Cervicovaginal ancillary only( [place]) - US OB LESS THAN 14 WEEKS WITH OB TRANSVAGINAL; Future  2. Nausea and vomiting in pregnancy - Patient reports nausea and emesis daily, has not taken any medication for N/V but requests medication  - Phenergan Rx sent to pharmacy due to early GA based on LMP if US shows dating over 10 weeks will switch medication to Zofran, patient aware and understands plan of care.  - promethazine (PHENERGAN) 12.5 MG tablet; Take 1 tablet (12.5 mg total) by mouth every 6 (six) hours as needed for nausea or vomiting.  Dispense: 30 tablet; Refill: 1  Initial labs drawn. Continue prenatal vitamins. Genetic Screening discussed, NIPS:  held due to early [place] . Ultrasound discussed; fetal anatomic survey: requested. Problem list reviewed and updated. The nature of [facility] with multiple MDs and other Advanced Practice Providers was explained to patient; also emphasized that residents, students are part of our team. Routine obstetric precautions reviewed. Return in about 4 weeks (around 04/27/2019) for ROB/genetic screening .     Sharyon Cable, CNM [facility] [employer]

## 2019-03-31 LAB — OBSTETRIC PANEL, INCLUDING HIV
Antibody Screen: NEGATIVE
Basophils Absolute: 0 10*3/uL (ref 0.0–0.2)
Basos: 1 %
EOS (ABSOLUTE): 0.1 10*3/uL (ref 0.0–0.4)
Eos: 2 %
HIV Screen 4th Generation wRfx: NONREACTIVE
Hematocrit: 36.9 % (ref 34.0–46.6)
Hemoglobin: 12.8 g/dL (ref 11.1–15.9)
Hepatitis B Surface Ag: NEGATIVE
Immature Grans (Abs): 0 10*3/uL (ref 0.0–0.1)
Immature Granulocytes: 0 %
Lymphocytes Absolute: 1.2 10*3/uL (ref 0.7–3.1)
Lymphs: 29 %
MCH: 32.8 pg (ref 26.6–33.0)
MCHC: 34.7 g/dL (ref 31.5–35.7)
MCV: 95 fL (ref 79–97)
Monocytes Absolute: 0.3 10*3/uL (ref 0.1–0.9)
Monocytes: 8 %
Neutrophils Absolute: 2.4 10*3/uL (ref 1.4–7.0)
Neutrophils: 60 %
Platelets: 264 10*3/uL (ref 150–450)
RBC: 3.9 x10E6/uL (ref 3.77–5.28)
RDW: 12.3 % (ref 11.7–15.4)
RPR Ser Ql: NONREACTIVE
Rh Factor: POSITIVE
Rubella Antibodies, IGG: 3.7 index (ref >0.99)
WBC: 4 10*3/uL (ref 3.4–10.8)

## 2019-03-31 LAB — CERVICOVAGINAL ANCILLARY ONLY
Bacterial vaginitis: POSITIVE — AB
Candida vaginitis: POSITIVE — AB
Chlamydia: NEGATIVE
Neisseria Gonorrhea: NEGATIVE
Trichomonas: NEGATIVE

## 2019-03-31 MED ORDER — TERCONAZOLE 0.8 % VA CREA: 1.0000 | TOPICAL_CREAM | Freq: Every day | VAGINAL | 0 refills | Status: AC

## 2019-03-31 MED ORDER — METRONIDAZOLE 500 MG PO TABS: 500.0000 mg | ORAL_TABLET | Freq: Two times a day (BID) | ORAL | 0 refills | Status: AC

## 2019-03-31 NOTE — Addendum Note (Signed)
Addended by: Sharyon Cable on: 03/31/2019 05:00 PM   Modules accepted: Orders

## 2019-04-01 LAB — URINE CULTURE, OB REFLEX

## 2019-04-01 LAB — CULTURE, OB URINE

## 2019-04-03 LAB — CYTOLOGY - PAP
Diagnosis: NEGATIVE
HPV: NOT DETECTED

## 2019-04-10 ENCOUNTER — Ambulatory Visit (HOSPITAL_COMMUNITY)
Admission: RE | Admit: 2019-04-10 | Discharge: 2019-04-10 | Disposition: A | Discharge status: Home / Self Care | Payer: Medicaid Other | Source: Ambulatory Visit | Attending: Certified Nurse Midwife | Admitting: Certified Nurse Midwife

## 2019-04-10 ENCOUNTER — Other Ambulatory Visit: Payer: Self-pay

## 2019-04-10 DIAGNOSIS — Z3480 Encounter for supervision of other normal pregnancy, unspecified trimester: Secondary | ICD-10-CM | POA: Insufficient documentation

## 2019-04-27 ENCOUNTER — Ambulatory Visit (INDEPENDENT_AMBULATORY_CARE_PROVIDER_SITE_OTHER): Payer: Non-veteran care | Admitting: Certified Nurse Midwife

## 2019-04-27 ENCOUNTER — Other Ambulatory Visit: Payer: Self-pay

## 2019-04-27 VITALS — BP 111/67 | HR 73 | Temp 98.2°F | Wt 134.0 lb

## 2019-04-27 DIAGNOSIS — Z3480 Encounter for supervision of other normal pregnancy, unspecified trimester: Secondary | ICD-10-CM

## 2019-04-27 DIAGNOSIS — O26892 Other specified pregnancy related conditions, second trimester: Secondary | ICD-10-CM

## 2019-04-27 DIAGNOSIS — M5442 Lumbago with sciatica, left side: Secondary | ICD-10-CM

## 2019-04-27 DIAGNOSIS — Z3A15 15 weeks gestation of pregnancy: Secondary | ICD-10-CM

## 2019-04-27 MED ORDER — COMFORT FIT MATERNITY SUPP SM MISC: 1.0000 [IU] | Freq: Every day | 0 refills | Status: AC | PRN

## 2019-04-27 NOTE — Progress Notes (Signed)
   PRENATAL VISIT NOTE  Subjective:  Katrina Kennedy is a 28 y.o. G3P1011 at [redacted]w[redacted]d being seen today for ongoing prenatal care.  She is currently monitored for the following issues for this low-risk pregnancy and has Supervision of other normal pregnancy, antepartum and Hx of anxiety disorder on their problem list.  Patient reports no complaints.  Contractions: Not present. Vag. Bleeding: None.  Movement: Present. Denies leaking of fluid.   The following portions of the patient's history were reviewed and updated as appropriate: allergies, current medications, past family history, past medical history, past social history, past surgical history and problem list.   Objective:   Vitals:   04/27/19 1057  BP: 111/67  Pulse: 73  Temp: 98.2 F (36.8 C)  Weight: 134 lb (60.8 kg)    Fetal Status: Fetal Heart Rate (bpm): 148   Movement: Present     General:  Alert, oriented and cooperative. Patient is in no acute distress.  Skin: Skin is warm and dry. No rash noted.   Cardiovascular: Normal heart rate noted  Respiratory: Normal respiratory effort, no problems with respiration noted  Abdomen: Soft, gravid, appropriate for gestational age.  Pain/Pressure: Absent     Pelvic: Cervical exam deferred        Extremities: Normal range of motion.  Edema: None  Mental Status: Normal mood and affect. Normal behavior. Normal judgment and thought content.   Assessment and Plan:  Pregnancy: G3P1011 at [redacted]w[redacted]d 1. Supervision of other normal pregnancy, antepartum - Patient doing well, reports occasional sharp pain that shoots down her left thigh- reports having this pain prior to pregnancy but pain increasing since  - Routine prenatal care - Anticipatory guidance on upcoming appointments with next being MyChart virtual appointment  - [place] changed to 10/15/19 based on [place] on 04/10/2019- patient aware and anatomy US ordered  - Patient reports receiving BP cuff in the mail but denies receiving email for  babyscripts- order reentered for babyscripts, encouraged patient to call the office if she does not receive email by tomorrow morning.  - [place] MFM OB COMP + 14 WK; Future  2. Acute bilateral low back pain with left-sided sciatica - Rx for maternity support belt given to patient with information on where to pick up support belt  - Encouraged slow position changes and use of maternity support belt  - Elastic Bandages & Supports (COMFORT FIT MATERNITY SUPP SM) MISC; 1 Units by Does not apply route daily as needed.  Dispense: 1 each; Refill: 0  Preterm labor symptoms and general obstetric precautions including but not limited to vaginal bleeding, contractions, leaking of fluid and fetal movement were reviewed in detail with the patient. Please refer to After Visit Summary for other counseling recommendations.   Return in about 4 weeks (around 05/25/2019) for ROB-mychart visit.  Future Appointments  Date Time Provider [place]  05/22/2019  1:15 PM [place] 4 WH-MFCUS MFC-US  05/25/2019  9:30 AM Lajean Manes, CNM [place] None    Lajean Manes, CNM

## 2019-04-27 NOTE — Patient Instructions (Signed)

## 2019-05-22 ENCOUNTER — Ambulatory Visit (HOSPITAL_COMMUNITY): Payer: Medicaid Other

## 2019-05-22 ENCOUNTER — Ambulatory Visit (HOSPITAL_COMMUNITY)
Admission: RE | Admit: 2019-05-22 | Discharge: 2019-05-22 | Disposition: A | Discharge status: Home / Self Care | Payer: Medicaid Other | Source: Ambulatory Visit | Attending: Obstetrics and Gynecology | Admitting: Obstetrics and Gynecology

## 2019-05-22 ENCOUNTER — Other Ambulatory Visit: Payer: Self-pay

## 2019-05-22 ENCOUNTER — Other Ambulatory Visit: Payer: Self-pay | Admitting: Certified Nurse Midwife

## 2019-05-22 ENCOUNTER — Other Ambulatory Visit (HOSPITAL_COMMUNITY): Payer: Self-pay | Admitting: *Deleted

## 2019-05-22 DIAGNOSIS — O283 Abnormal ultrasonic finding on antenatal screening of mother: Secondary | ICD-10-CM | POA: Insufficient documentation

## 2019-05-22 DIAGNOSIS — F419 Anxiety disorder, unspecified: Secondary | ICD-10-CM | POA: Diagnosis not present

## 2019-05-22 DIAGNOSIS — Z3A19 19 weeks gestation of pregnancy: Secondary | ICD-10-CM | POA: Diagnosis not present

## 2019-05-22 DIAGNOSIS — Z3480 Encounter for supervision of other normal pregnancy, unspecified trimester: Secondary | ICD-10-CM

## 2019-05-22 DIAGNOSIS — O350XX0 Maternal care for (suspected) central nervous system malformation in fetus, not applicable or unspecified: Secondary | ICD-10-CM

## 2019-05-22 DIAGNOSIS — O358XX0 Maternal care for other (suspected) fetal abnormality and damage, not applicable or unspecified: Secondary | ICD-10-CM | POA: Diagnosis not present

## 2019-05-22 DIAGNOSIS — O3503X0 Maternal care for (suspected) central nervous system malformation or damage in fetus, choroid plexus cysts, not applicable or unspecified: Secondary | ICD-10-CM

## 2019-05-22 DIAGNOSIS — O99342 Other mental disorders complicating pregnancy, second trimester: Secondary | ICD-10-CM | POA: Diagnosis not present

## 2019-05-25 ENCOUNTER — Telehealth: Payer: Non-veteran care | Admitting: Certified Nurse Midwife

## 2019-05-26 LAB — MATERNIT 21 PLUS CORE, BLOOD
Fetal Fraction: 10
Result (T21): NEGATIVE
Trisomy 13 (Patau syndrome): NEGATIVE
Trisomy 18 (Edwards syndrome): NEGATIVE
Trisomy 21 (Down syndrome): NEGATIVE

## 2019-05-30 ENCOUNTER — Telehealth (HOSPITAL_COMMUNITY): Payer: Self-pay | Admitting: Genetic Counselor

## 2019-05-30 NOTE — Telephone Encounter (Signed)
I called Ms. Donaho to discuss her negative noninvasive prenatal screening (NIPS)/cell free DNA (cfDNA) testing result. Specifically, Ms. Kratz had MaterniT21 testing through [place] was offered because of choroid plexus cysts identified on ultrasound. These negative results demonstrated an expected representation of chromosome 21, 18, and 13 material, greatly reducing the likelihood of trisomies 21, 13, or 18 for the pregnancy. Ms. Mahr requested to know about the expected fetal sex, which was female.  NIPS analyzes placental (fetal) DNA in maternal circulation. NIPS is considered to be highly specific and sensitive, but is not considered to be a diagnostic test. We reviewed that this testing identifies the majority of pregnancies with trisomies 95, 66, and 33, as well as the sex of the baby. Ms. Scaglione was reminded that diagnostic testing via amniocentesis is available should she be interested in confirming this result.   Ms. Perren indicated that she may be interested in pursuing amniocentesis after her follow-up scan in 4 weeks. She wishes to wait until then to see if the choroid plexus cysts persist before making her decision. I offered to see her following the ultrasound at her next appointment to discuss the choroid plexus cysts and the option of amniocentesis if she is interested in doing so at that time.   Ms. Zapf confirmed that she had no questions about these results.  Buelah Manis, MS Genetic Counselor

## 2019-06-01 ENCOUNTER — Telehealth: Payer: Self-pay | Admitting: Certified Nurse Midwife

## 2019-07-17 ENCOUNTER — Ambulatory Visit (HOSPITAL_COMMUNITY): Payer: Non-veteran care

## 2019-07-17 ENCOUNTER — Encounter (HOSPITAL_COMMUNITY): Payer: Self-pay

## 2020-03-15 IMAGING — US US OB < 14 WEEKS - US OB TV
1 series · 15 of 28 positions shown · non-contrast
Comparison: None.

CLINICAL DATA: 27 y/o F; abdominal pain and spotting 2 weeks ago.
Quantitative beta HCG 157.

EXAM:
OBSTETRIC <14 WK US AND TRANSVAGINAL OB US
TECHNIQUE: Both transabdominal and transvaginal ultrasound examinations were
performed for complete evaluation of the gestation as well as the
maternal uterus, adnexal regions, and pelvic cul-de-sac.
Transvaginal technique was performed to assess early pregnancy.

[Series 1: us ob < 14 weeks - us ob tv · 15 of 40 slices shown]
[im 1/40]
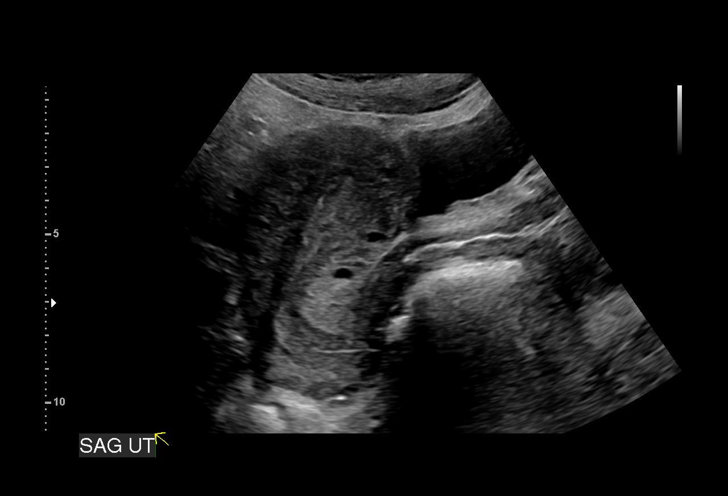
[im 3/40]
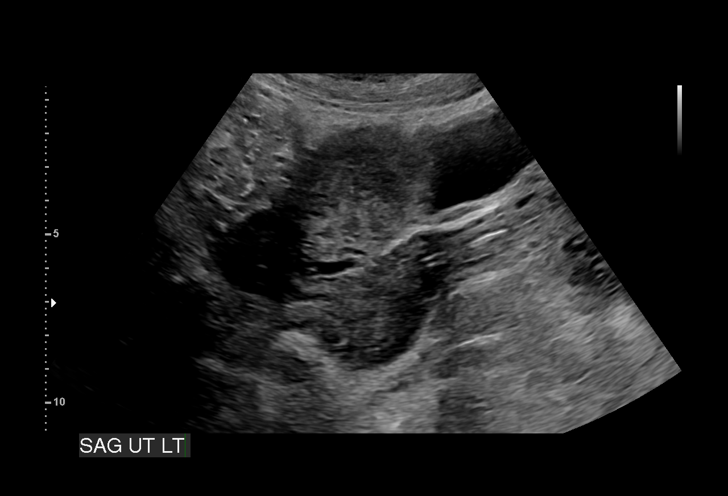
[im 6/40]
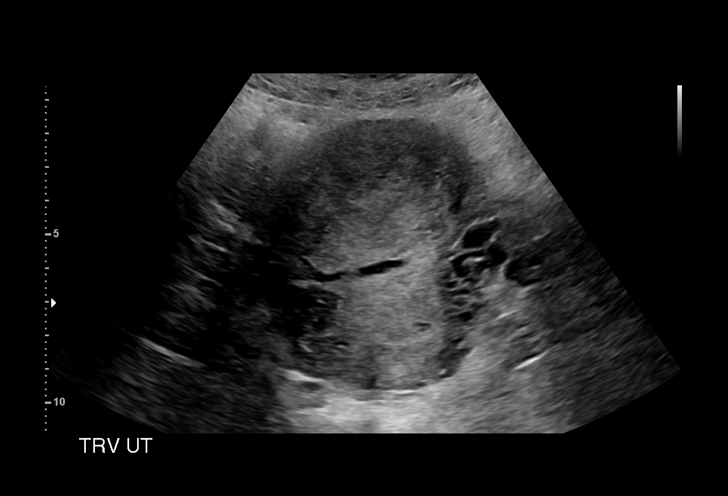
[im 9/40]
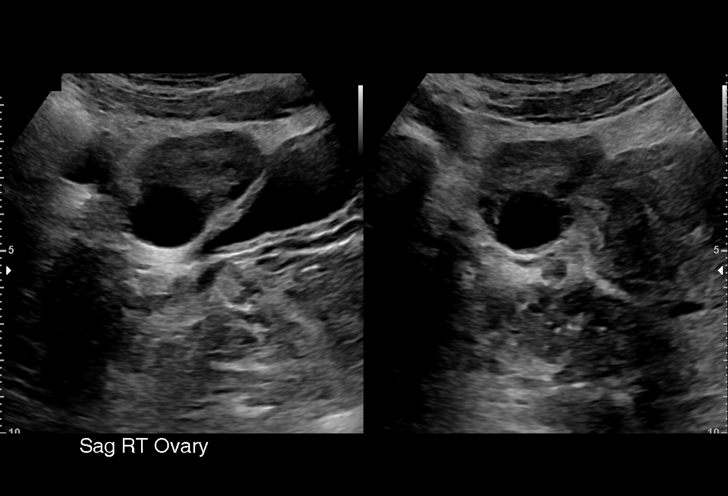
[im 12/40]
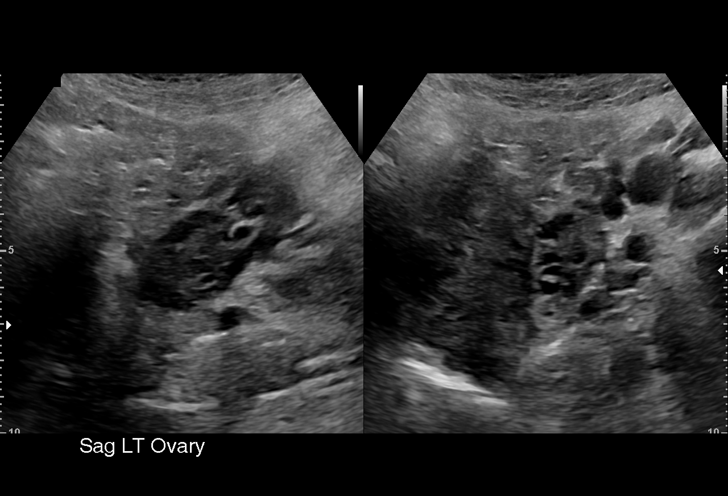
[im 15/40]
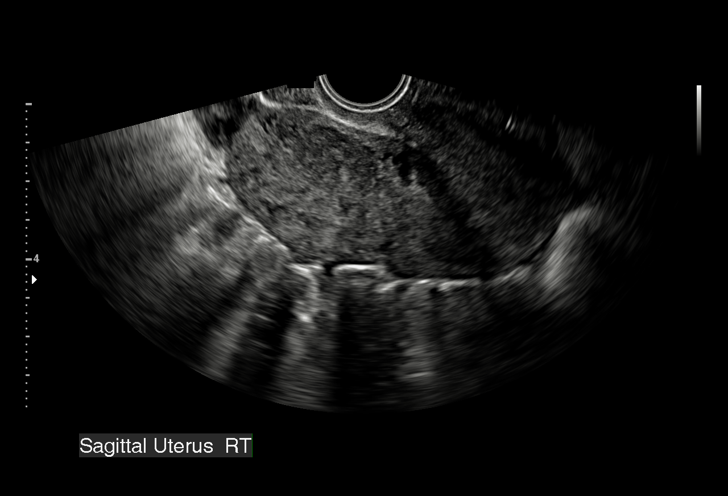
[im 18/40]
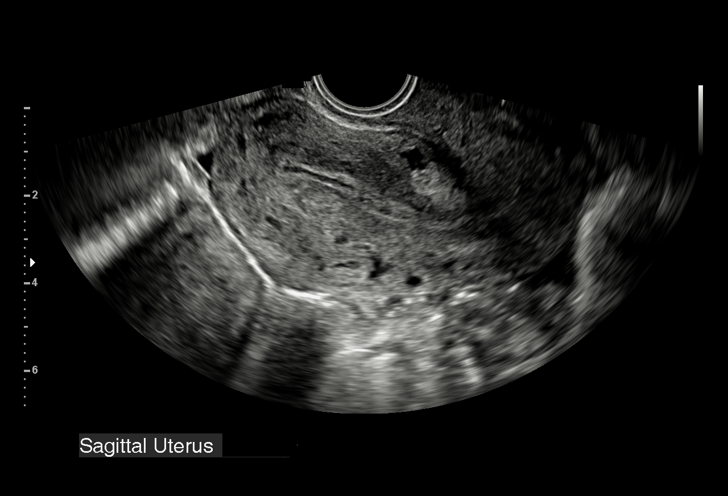
[im 21/40]
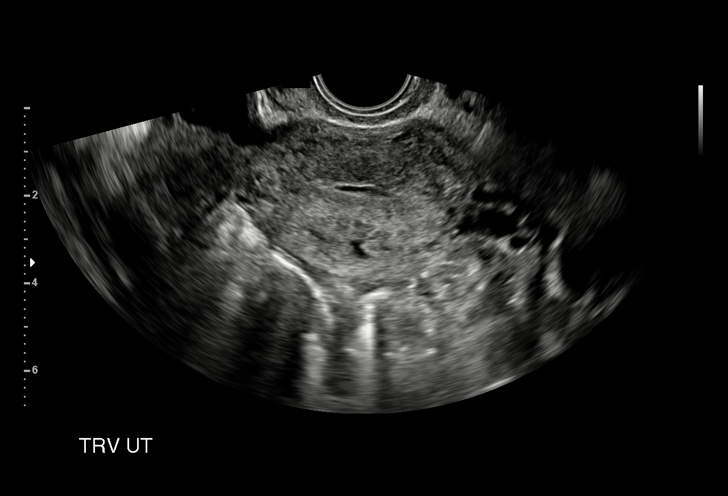
[im 22/40]
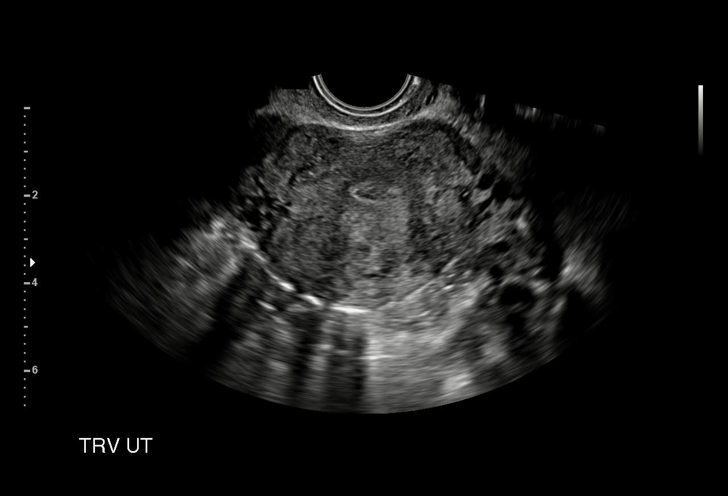
[im 25/40]
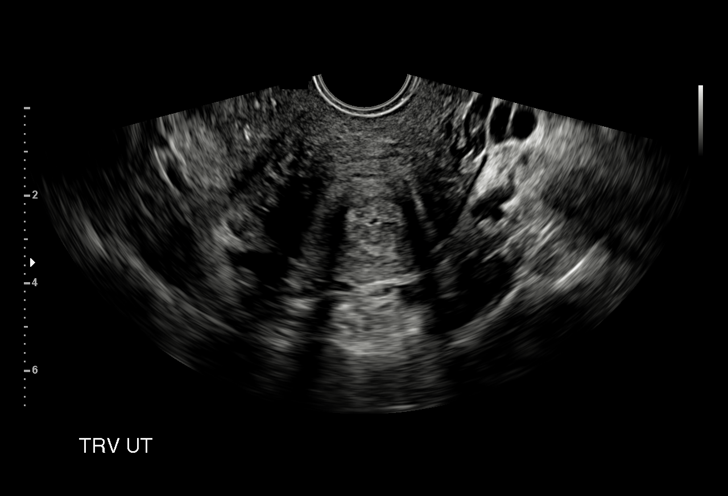
[im 28/40]
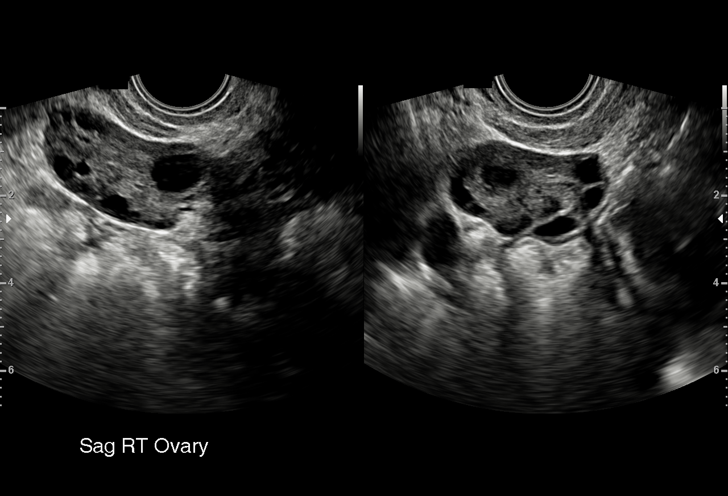
[im 31/40]
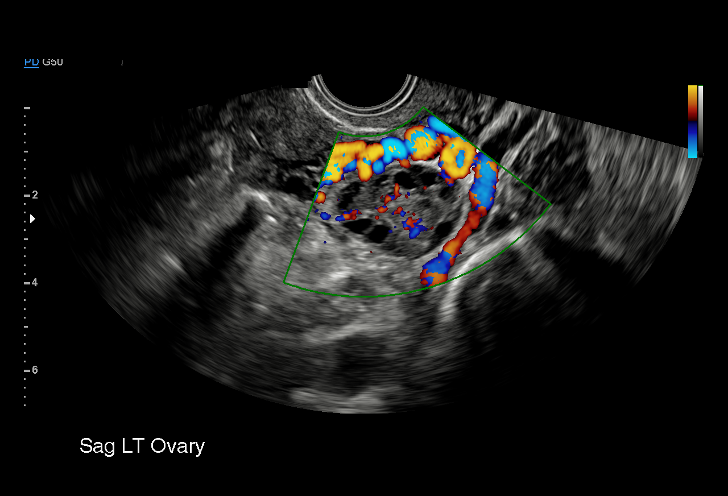
[im 34/40]
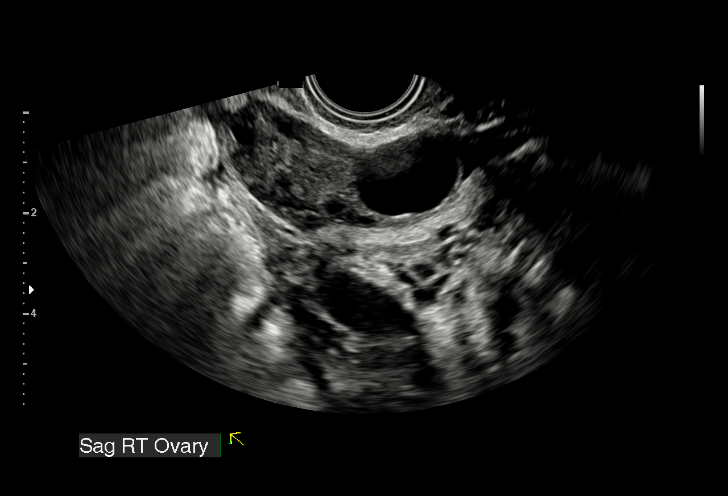
[im 37/40]
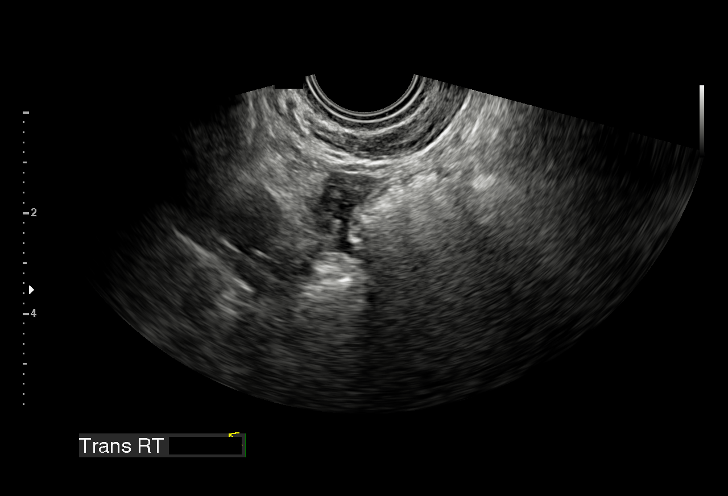
[im 40/40]
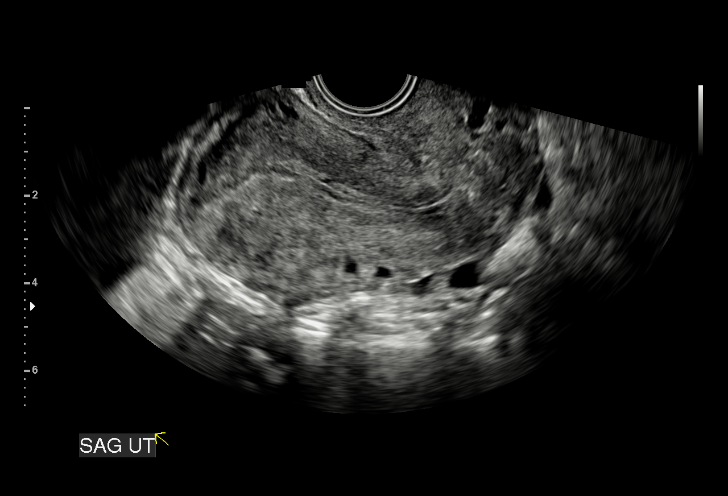

[15 of 28 positions shown; findings below may reference images not displayed]

FINDINGS: Intrauterine gestational sac: None

Yolk sac:  Not Visualized.

Embryo:  Not Visualized.

Cardiac Activity: Not Visualized.

Subchorionic hemorrhage:  None visualized.

Maternal uterus/adnexae: Normal appearance of the bilateral ovaries
in the uterus. No adnexal mass. Right ovary corpus luteum cyst.
IMPRESSION: Pregnancy of unknown location or recent spontaneous abortion.
Intrauterine pregnancy may not be identified with a beta HCG of less
than [DATE]. Follow-up beta HCG and possible pelvic ultrasound as
indicated is recommended to evaluate pregnancy location. This
recommendation follows SRU consensus guidelines: Diagnostic Criteria
for Nonviable Pregnancy Early in the First Trimester. N Engl J Med

## 2020-03-28 IMAGING — US OBSTETRIC <14 WK US AND TRANSVAGINAL OB US
1 series · 14 of 28 positions shown · non-contrast
Comparison: None.

CLINICAL DATA: Vaginal bleeding. Possible spontaneous abortion.
Bleeding in mid [place] with subsequent quantitative beta HCG of
157.

EXAM:
OBSTETRIC <14 WK US AND TRANSVAGINAL OB US
TECHNIQUE: Both transabdominal and transvaginal ultrasound examinations were
performed for complete evaluation of the gestation as well as the
maternal uterus, adnexal regions, and pelvic cul-de-sac.
Transvaginal technique was performed to assess early pregnancy.

[Series 1: obstetric <14 wk us and transvaginal ob us · 0.20mm/px · 14 of 163 slices shown]
[im 7/163]
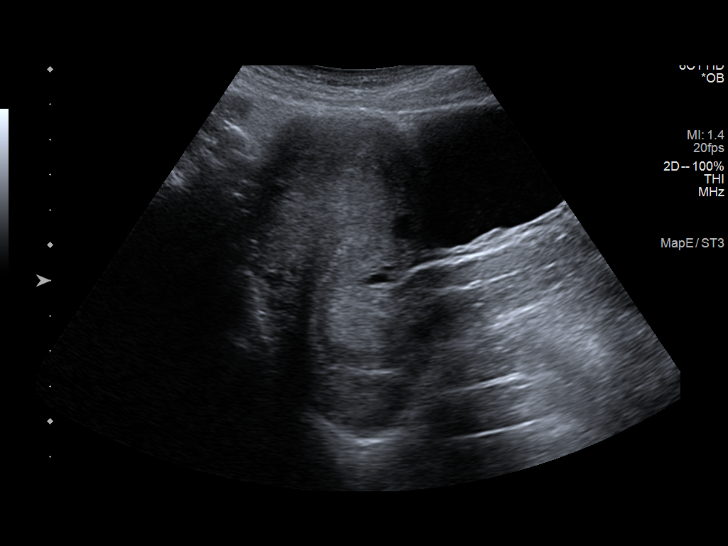
[im 19/163]
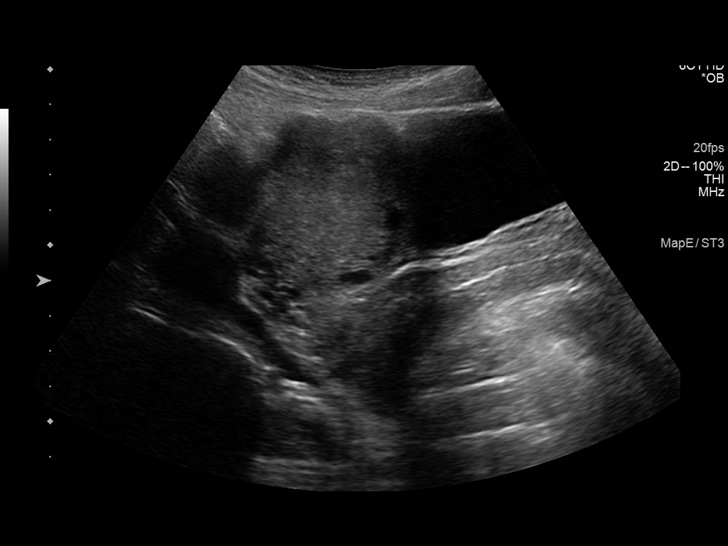
[im 31/163]
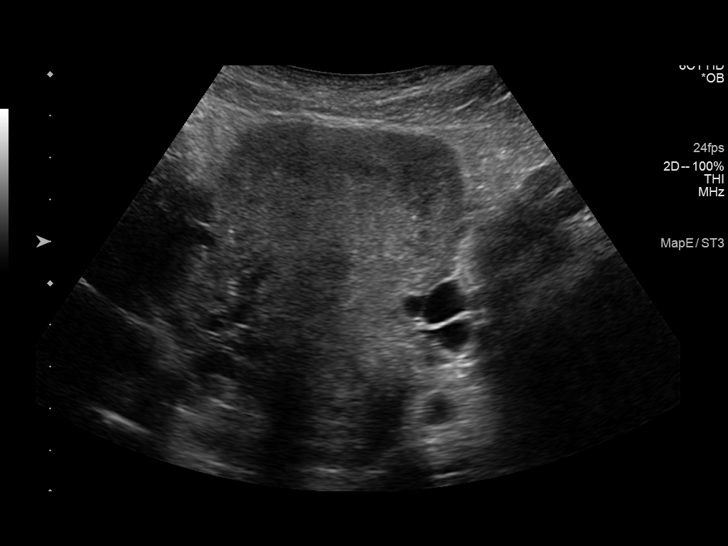
[im 43/163]
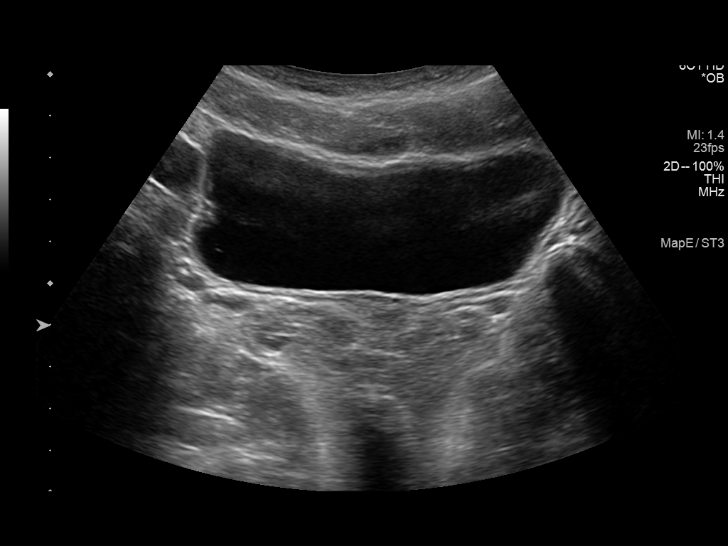
[im 55/163]
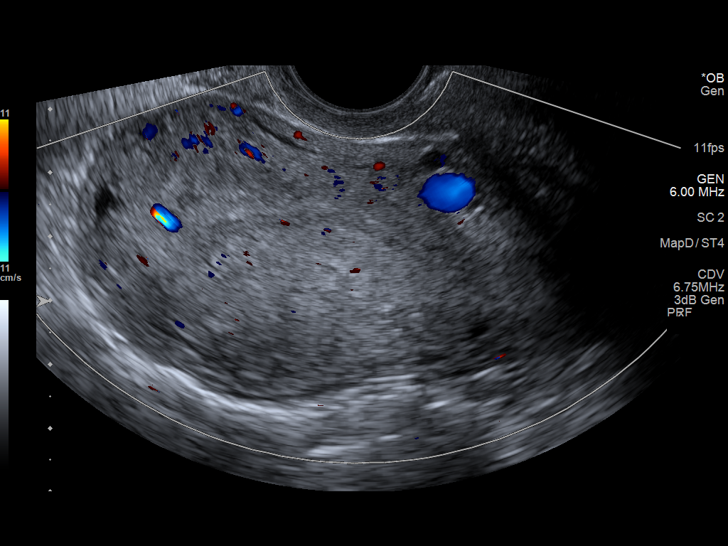
[im 67/163]
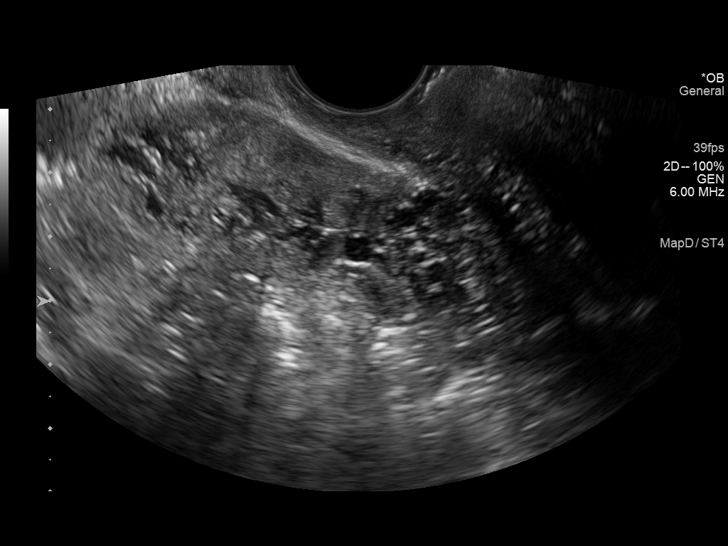
[im 79/163]
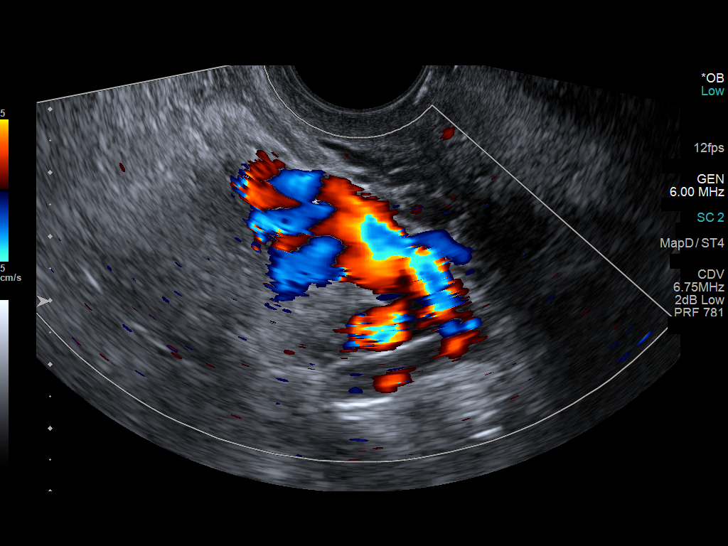
[im 91/163]
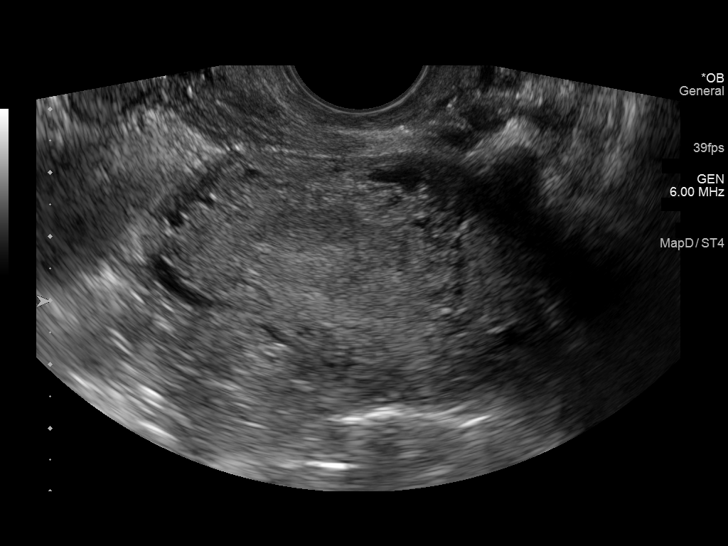
[im 103/163]
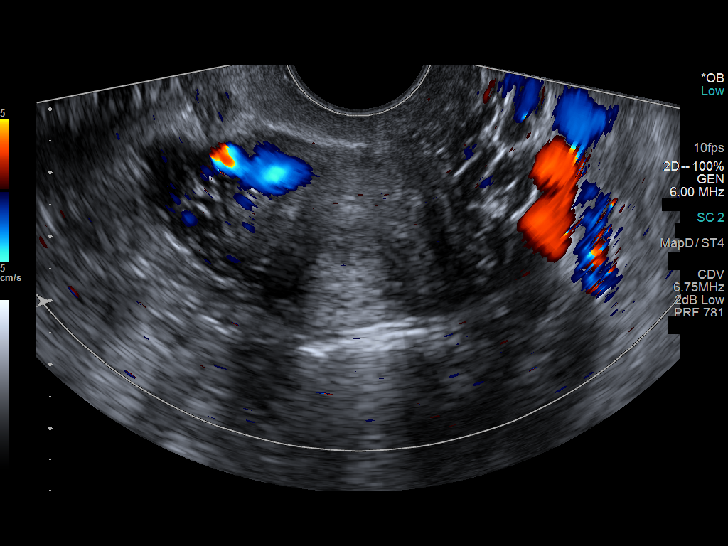
[im 115/163]
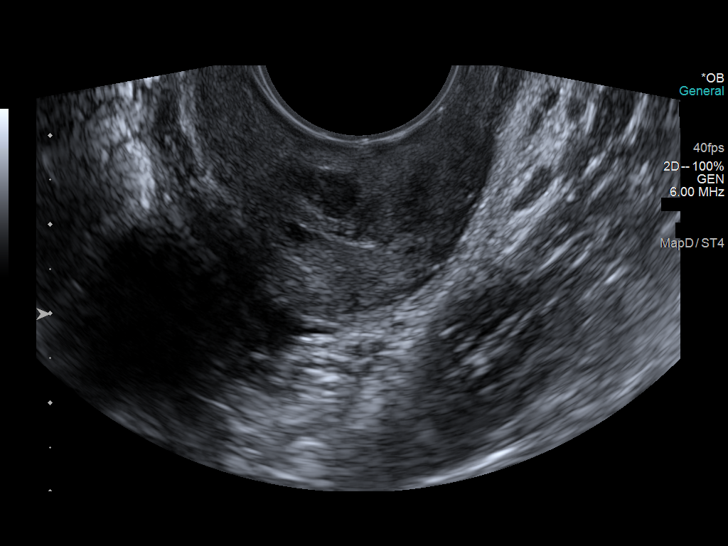
[im 127/163]
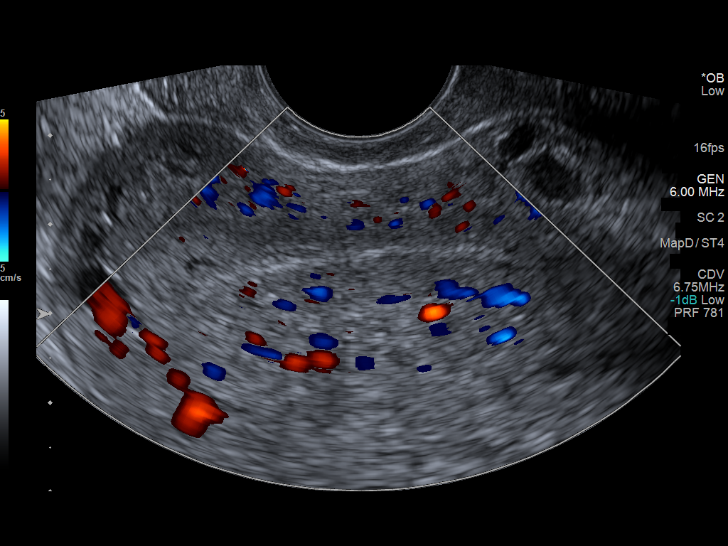
[im 139/163]
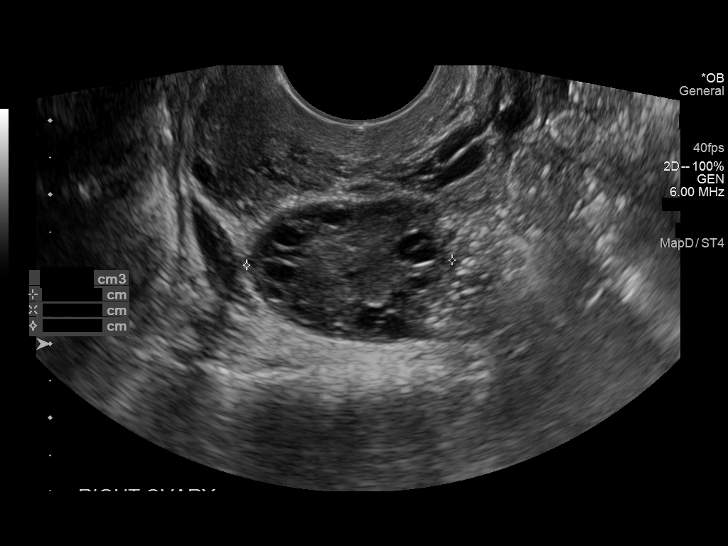
[im 151/163]
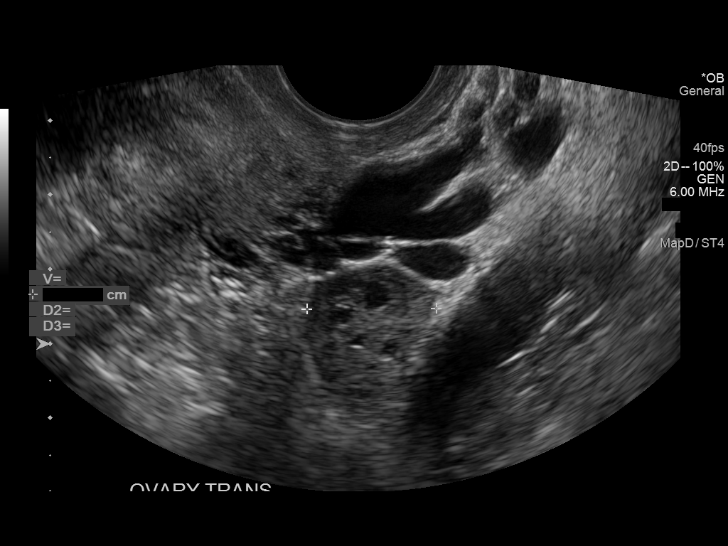
[im 163/163]
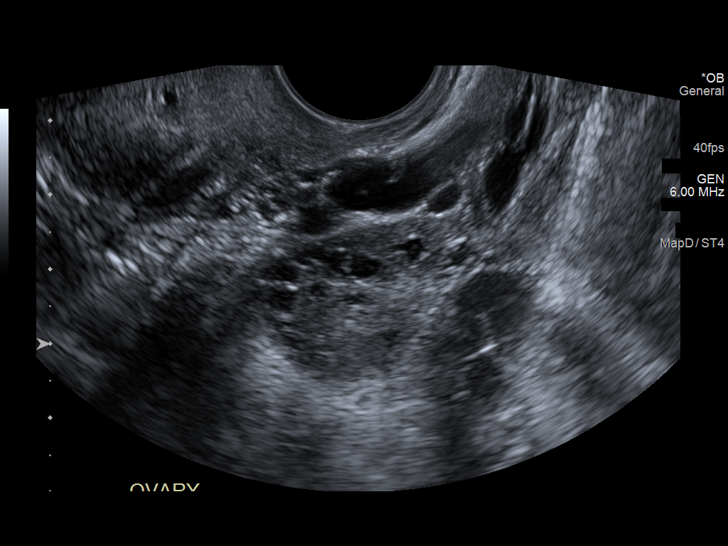

[14 of 28 positions shown; findings below may reference images not displayed]

FINDINGS: Intrauterine gestational sac: None visualized.

Maternal uterus/adnexae: No intrauterine products of conception are
present. There is some increased flow throughout the myometrium. No
discrete lesion is present. Adnexa are within normal limits. A small
amount of free fluid is present.
IMPRESSION: 1. No intrauterine pregnancy.
2. No retained products of conception.
3. Small amount of free fluid may be physiologic.

## 2020-06-30 IMAGING — US OBSTETRIC <14 WK US AND TRANSVAGINAL OB US
1 series · 15 of 28 positions shown · non-contrast
Comparison: 12/24/2018

CLINICAL DATA: First trimester pregnancy, supervision of normal
pregnancy antepartum, dating

EXAM:
OBSTETRIC <14 WK US AND TRANSVAGINAL OB US
TECHNIQUE: Both transabdominal and transvaginal ultrasound examinations were
performed for complete evaluation of the gestation as well as the
maternal uterus, adnexal regions, and pelvic cul-de-sac.
Transvaginal technique was performed to assess early pregnancy.

[Series 1: obstetric <14 wk us and transvaginal ob us · 15 of 33 slices shown]
[im 1/33]
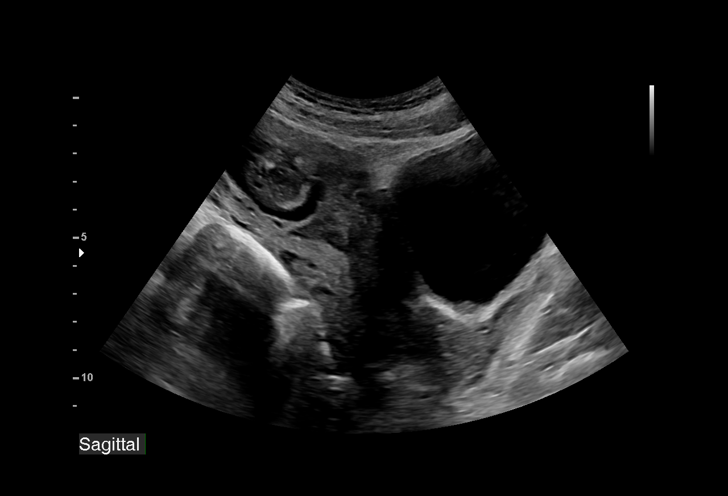
[im 3/33]
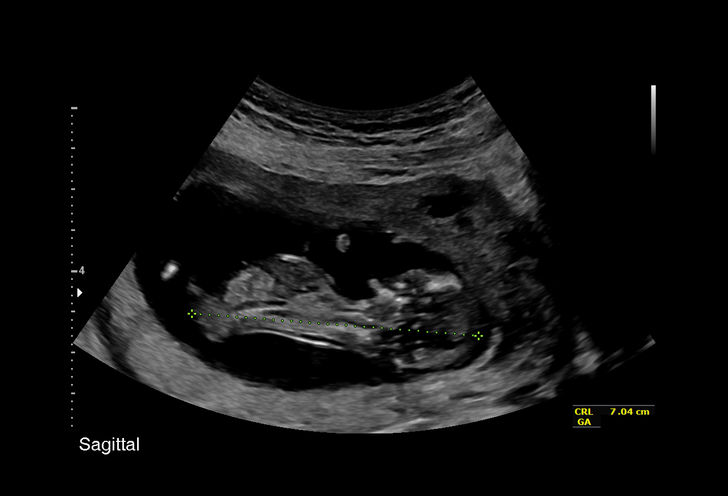
[im 5/33]
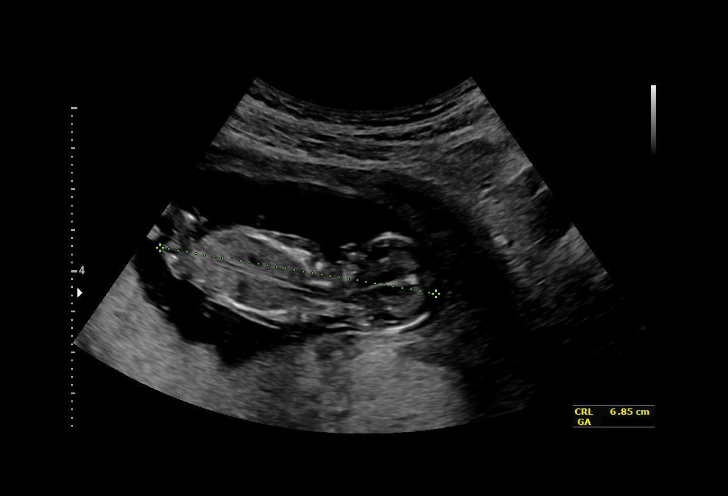
[im 8/33]
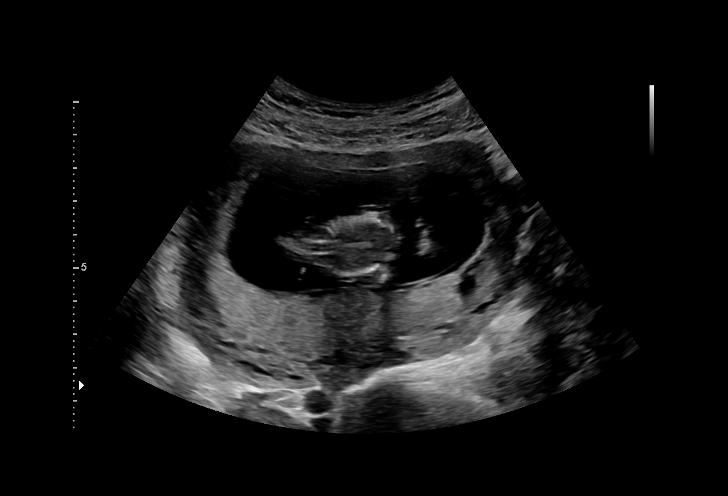
[im 10/33]
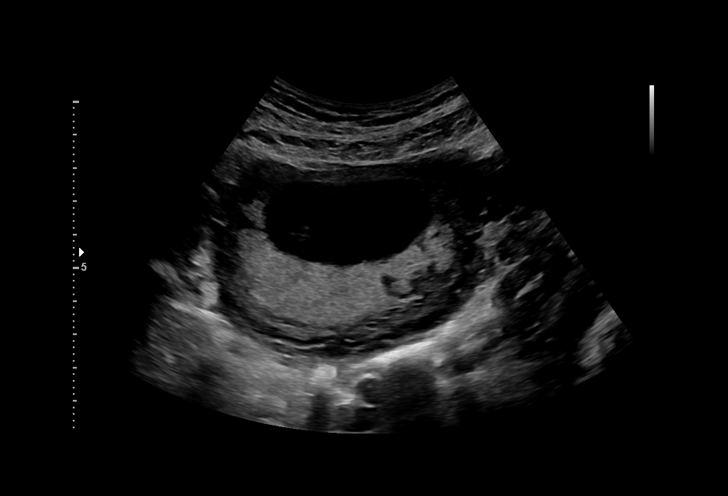
[im 12/33]
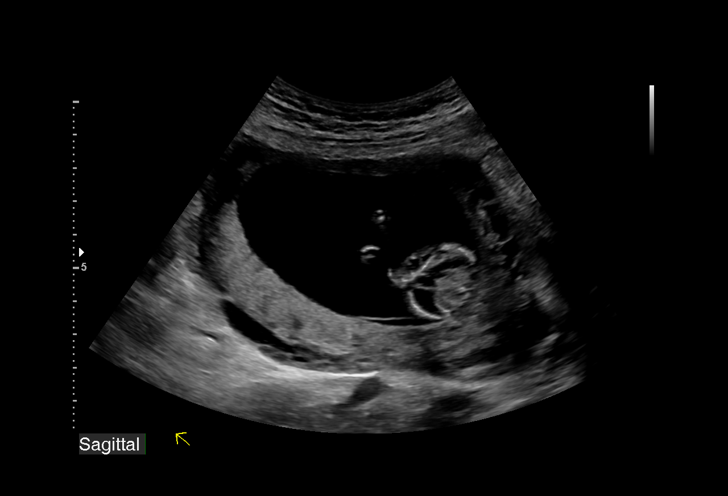
[im 15/33]
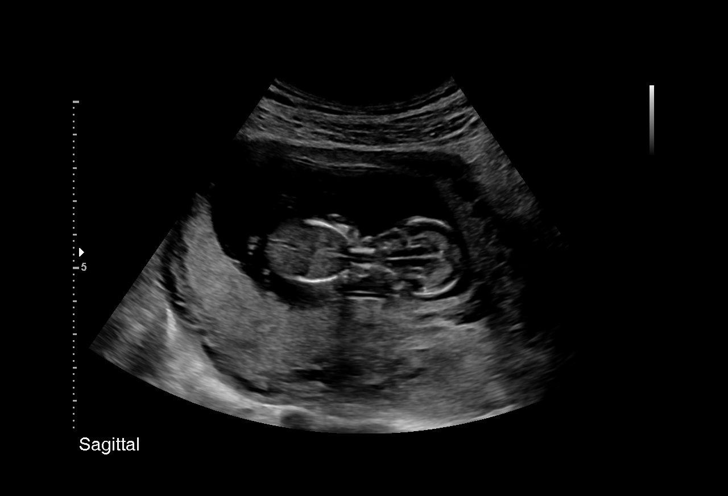
[im 17/33]
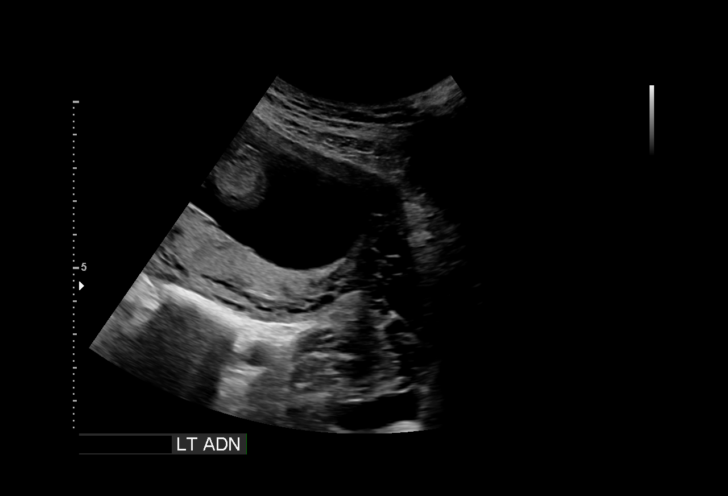
[im 18/33]
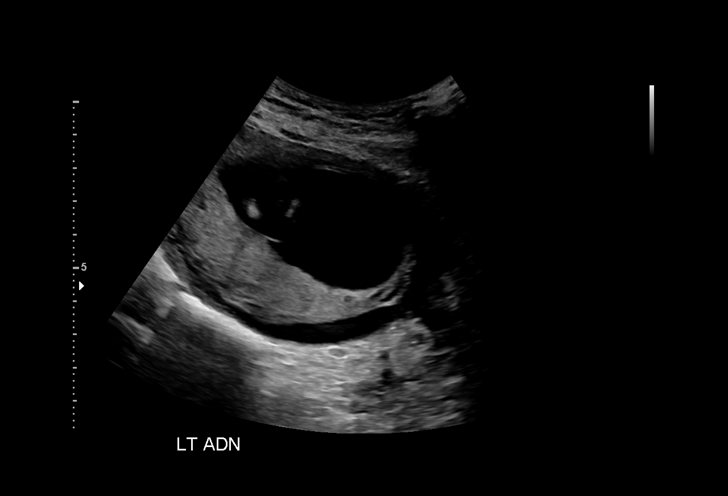
[im 21/33]
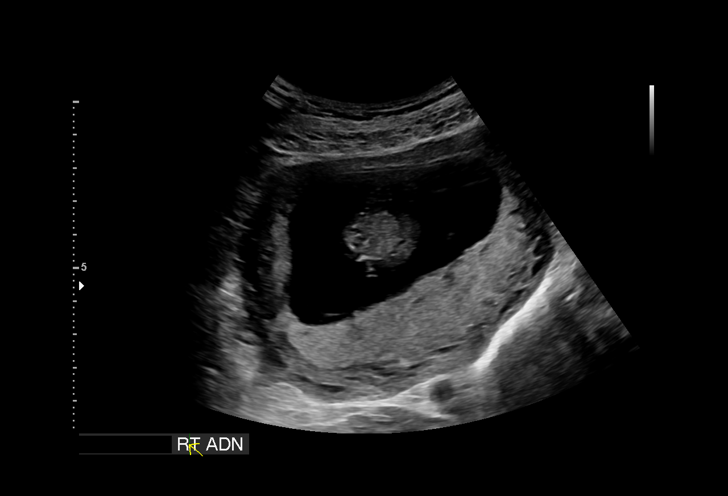
[im 23/33]
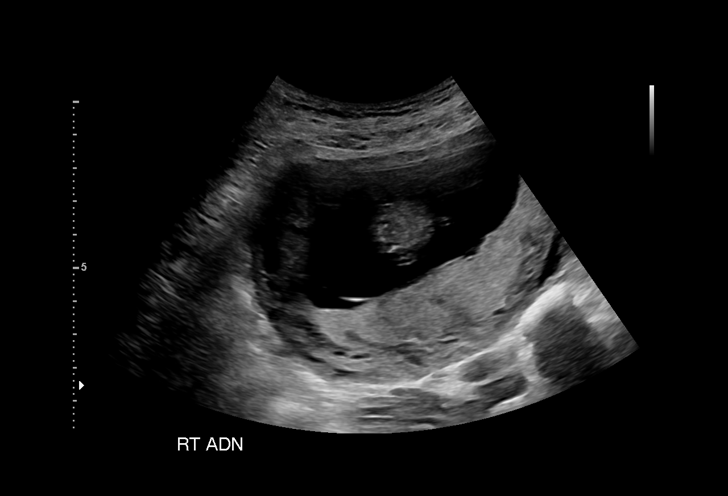
[im 25/33]
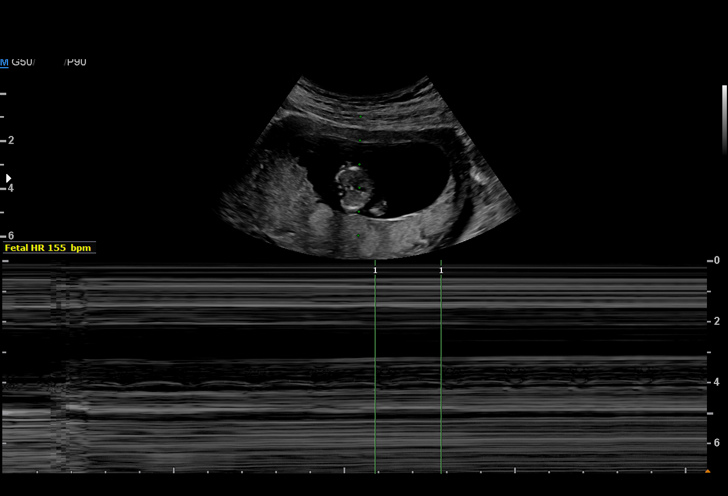
[im 28/33]
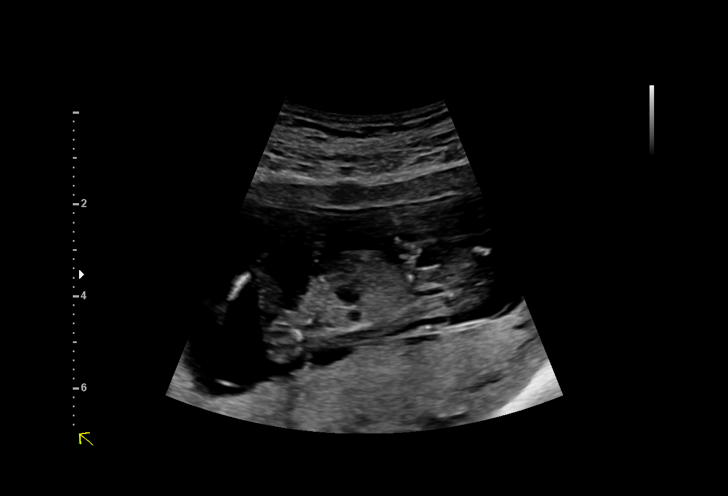
[im 30/33]
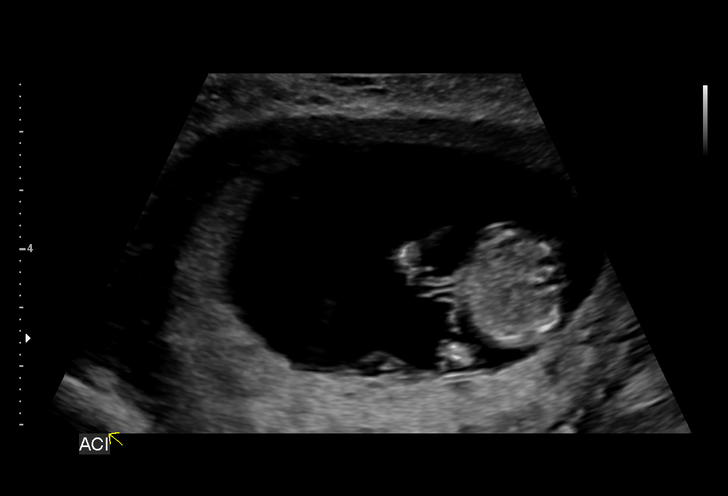
[im 33/33]
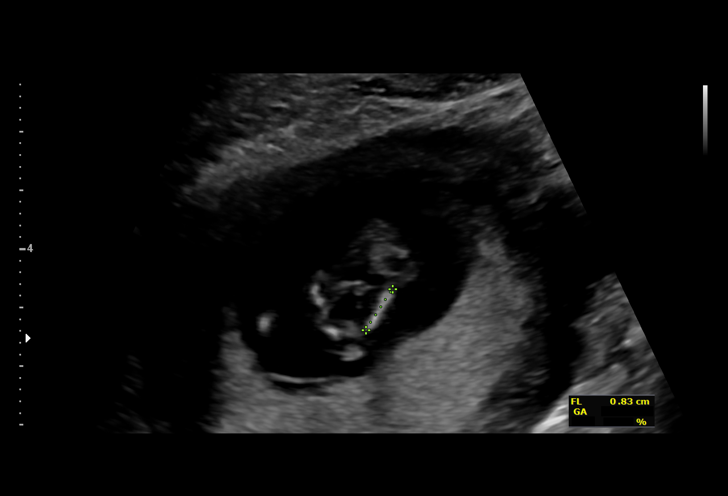

[15 of 28 positions shown; findings below may reference images not displayed]

FINDINGS: Intrauterine gestational sac: Present, single

Yolk sac:  Not visualized

Embryo:  Present

Cardiac Activity: Present

Heart Rate: 155 bpm

CRL:  69.5 mm   13 w   1 d                  US EDC: 10/15/2019

Subchorionic hemorrhage:  None

Maternal uterus/adnexae:

Neither ovary visualized, likely due to uterine size and bowel.

No adnexal masses or free pelvic fluid.

Posterior placenta.
IMPRESSION: Single live intrauterine gestation at 13 weeks 1 day EGA by
crown-rump length.

No acute abnormalities.

## 2020-08-11 IMAGING — US US MFM OB DETAIL +14 WK
1 series · 12 of 28 positions shown · non-contrast
Comparison: none

[Series 1: us mfm ob detail +14 wk · 12 of 98 slices shown]
[im 4/98]
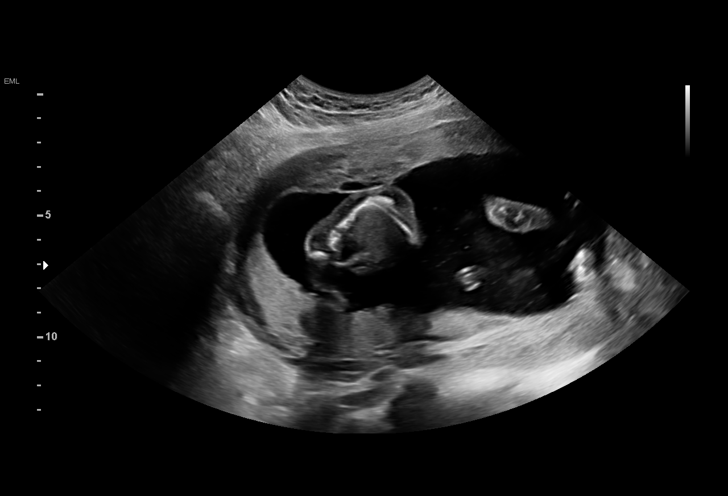
[im 11/98]
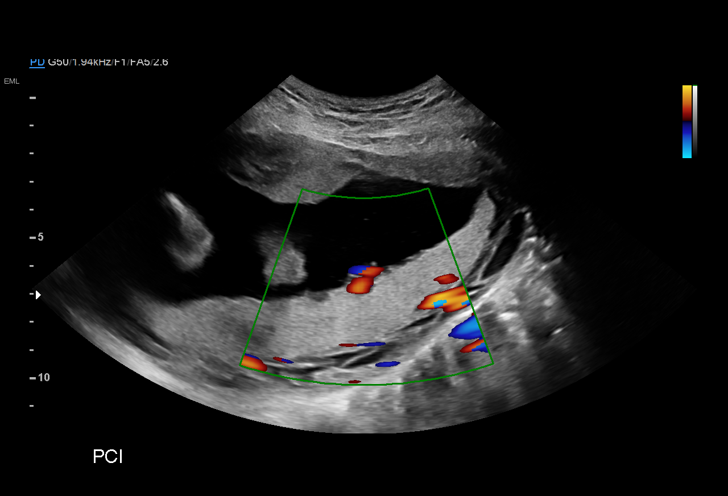
[im 18/98]
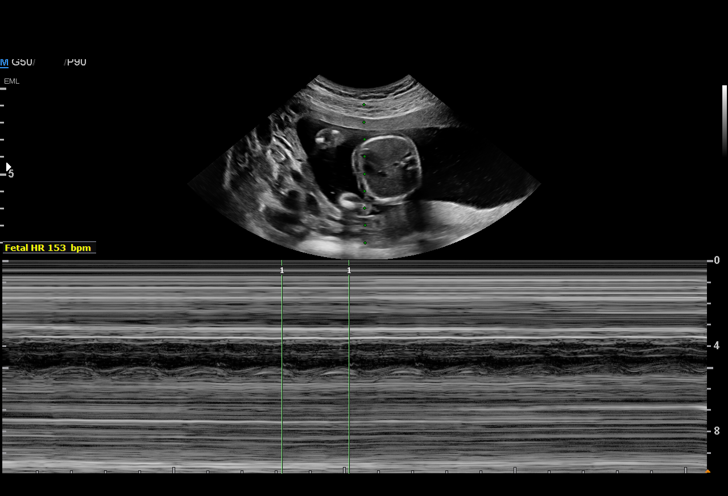
[im 29/98]
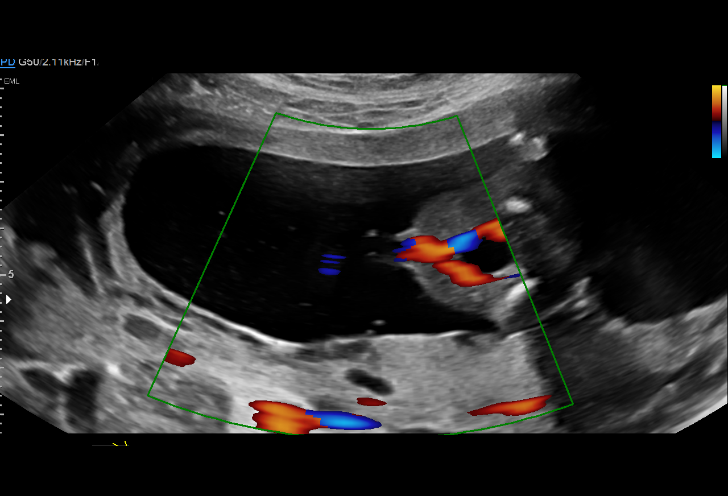
[im 36/98]
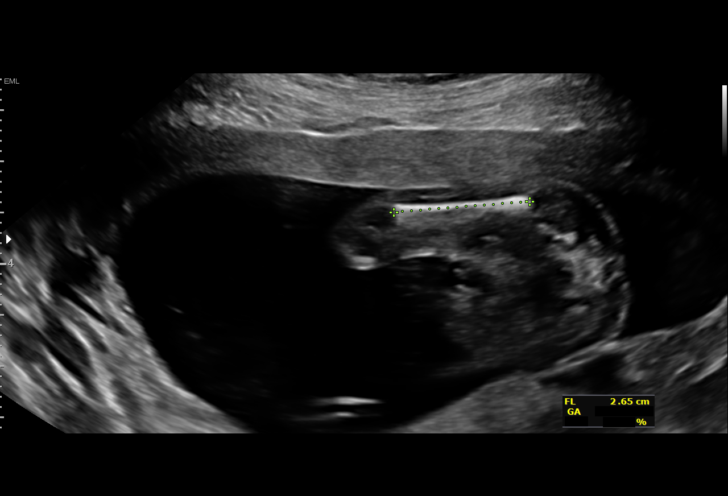
[im 44/98]
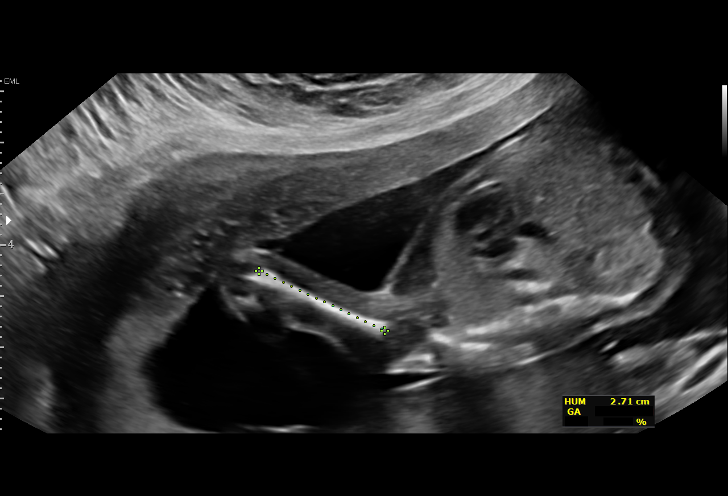
[im 54/98]
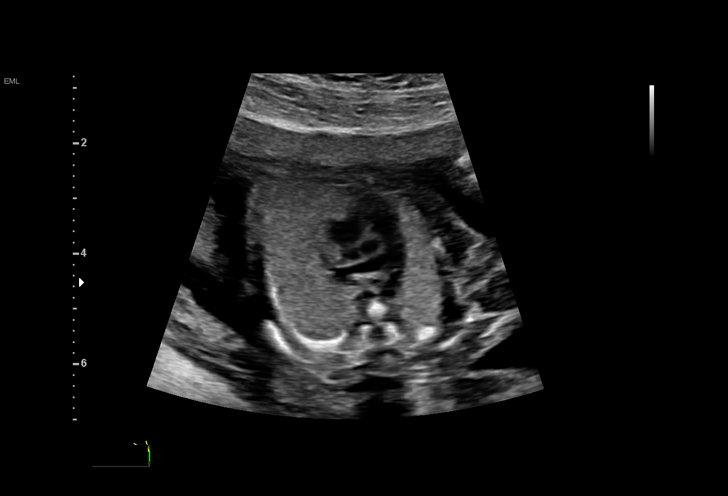
[im 62/98]
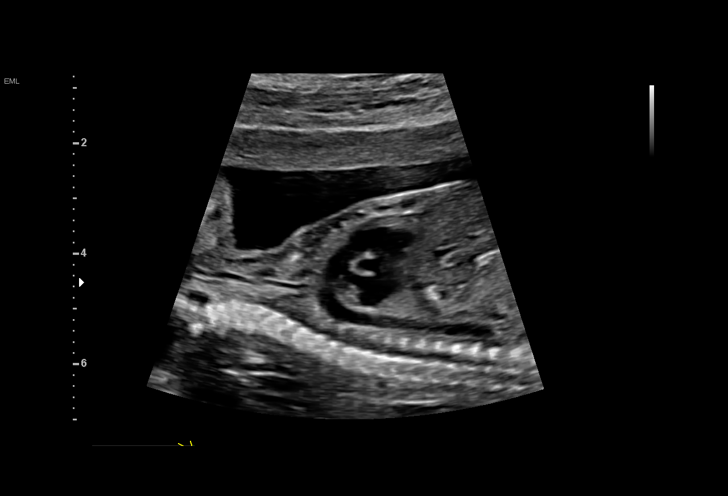
[im 69/98]
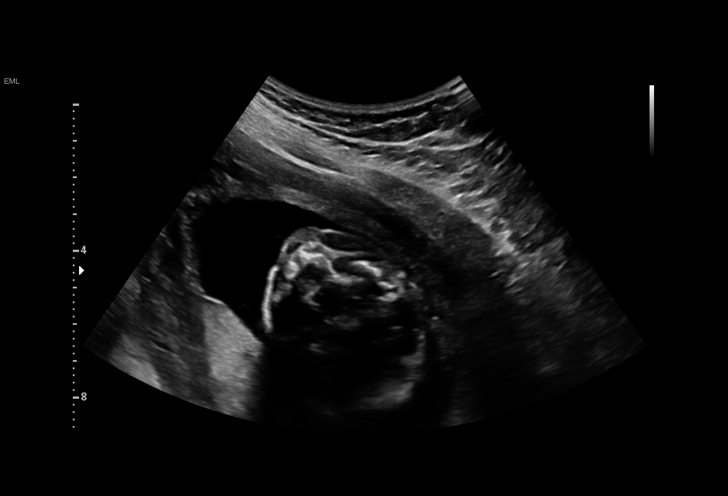
[im 80/98]
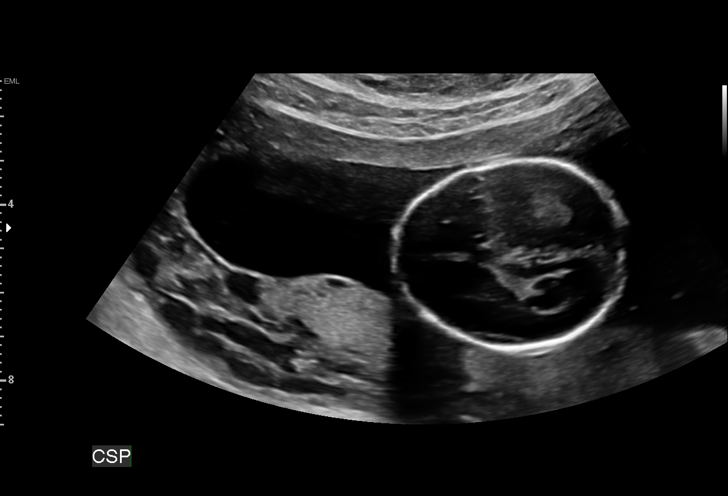
[im 87/98]
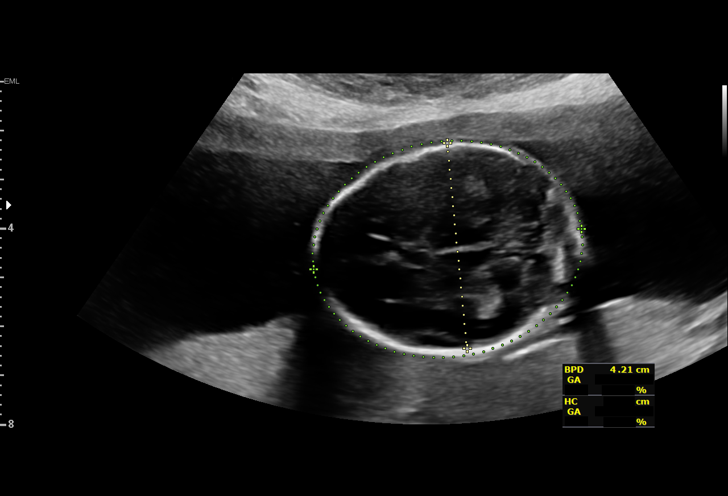
[im 94/98]
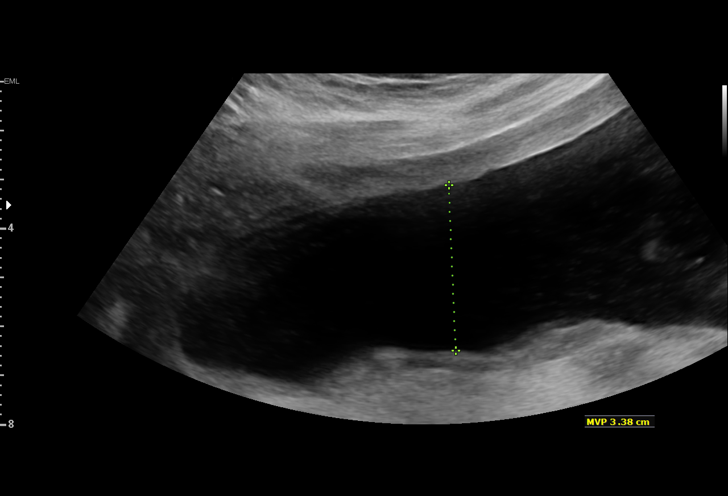

[12 of 28 positions shown; findings below may reference images not displayed]

TIGER CNM

 ----------------------------------------------------------------------

 ----------------------------------------------------------------------
Indications

  19 weeks gestation of pregnancy
  Encounter for antenatal screening for
  malformations (no genetic testing)
  Anxiety during pregnancy, second trimester     O99.342,
  Fetal choroid plexus cyst
 ----------------------------------------------------------------------
Vital Signs

 BMI:
Fetal Evaluation

 Num Of Fetuses:         1
 Fetal Heart Rate(bpm):  153
 Cardiac Activity:       Observed
 Presentation:           Breech
 Placenta:               Posterior
 P. Cord Insertion:      Visualized, central

 Amniotic Fluid
 AFI FV:      Within normal limits

                             Largest Pocket(cm)

Biometry

 BPD:      40.6  mm     G. Age:  18w 2d         17  %    CI:        67.27   %    70 - 86
                                                         FL/HC:      17.0   %    16.1 -
 HC:      158.5  mm     G. Age:  18w 5d         23  %    HC/AC:      1.18        1.09 -
 AC:      134.7  mm     G. Age:  19w 0d         38  %    FL/BPD:     66.5   %
 FL:         27  mm     G. Age:  18w 2d         14  %    FL/AC:      20.0   %    20 - 24
 HUM:      27.4  mm     G. Age:  18w 5d         40  %
 CER:      19.6  mm     G. Age:  18w 6d         41  %
 NFT:       3.2  mm

 LV:        5.4  mm
 CM:        5.1  mm

 Est. FW:     248  gm      0 lb 9 oz     18  %
OB History

 Gravidity:    3         Term:   1        Prem:   0        SAB:   1
 TOP:          0       Ectopic:  0        Living: 1
Gestational Age

 LMP:           16w 0d        Date:  01/30/19                 EDD:   11/06/19
 U/S Today:     18w 4d                                        EDD:   10/19/19
 Best:          19w 1d     Det. By:  Early Ultrasound         EDD:   10/15/19
                                     (04/10/19)
Anatomy

 Cranium:               Appears normal         LVOT:                   Appears normal
 Cavum:                 Appears normal         Aortic Arch:            Appears normal
 Ventricles:            Appears normal         Ductal Arch:            Appears normal
 Choroid Plexus:        Bilateral choroid      Diaphragm:              Appears normal
                        plexus cysts
 Cerebellum:            Appears normal         Stomach:                Appears normal, left
                                                                       sided
 Posterior Fossa:       Appears normal         Abdomen:                Appears normal
 Nuchal Fold:           Appears normal         Abdominal Wall:         Appears nml (cord
                                                                       insert, abd wall)
 Face:                  Appears normal         Cord Vessels:           Appears normal (3
                        (orbits and profile)                           vessel cord)
 Lips:                  Appears normal         Kidneys:                Bilaterally enlarged
 Palate:                Appears normal         Bladder:                Appears normal
 Thoracic:              Appears normal         Spine:                  Appears normal
 Heart:                 Appears normal         Upper Extremities:      Appears normal
                        (4CH, axis, and
                        situs)
 RVOT:                  Appears normal         Lower Extremities:      Appears normal

 Other:  Heels and 5th digit visualized. Fetus appears to be a male. Nasal
         bone visualized.
Cervix Uterus Adnexa

 Cervix
 Length:           3.79  cm.
 Normal appearance by transabdominal scan.

 Left Ovary
 Within normal limits.

 Right Ovary
 Within normal limits.

 Adnexa
 No abnormality visualized.
Impression

 Ms. Bello, Golden2 P1 at 19-weeks' gestation, is here for fetal
 anatomy scan. She has not had screening for fetal
 aneuploidies.
 Obstetric history is significant for a term vaginal delivery of a
 male infant. Her pregnancy was uncomplicated.

 We performed a fetal anatomy scan. Bilateral choroid plexus
 cysts (CPC) are seen. No other markers of aneuploidies or
 fetal structural defects are seen. Both kidneys appear
 enlarged (23 millimeters) and the length measures at the
 97th centile. No increased echogenicities or cysts are seen.
 Fetal biometry is consistent with her previously-established
 dates. Amniotic fluid is normal and good fetal activity is seen.
 Patient understands the limitations of ultrasound in detecting
 fetal anomalies.

 CPC: I counseled the patient that isolated CPC is only rarely
 associated with chromosomal anomaly (trisomy 18). I also
 reassured her that CPC is not associated with structural
 malformations in the brain. CPCs usually resolve with
 advancing gestation, but the disappearance does not
 diminish the risk for aneuploidy.

 I discussed the following options: 1) Maternal blood cell-free
 fetal DNA screening  for trisomies 21, 18 and 13, which has a
 greater detection rate than conventional screening tests. I
 informed the patient that not all chromosomal malformations
 are detected by this test. 2) I informed her that only
 amniocentesis will give a definitive result on the fetal
 karyotype. I discussed a procedure-related pregnancy loss of
 about 1 in 500.

 Patient opted to have cell-free fetal DNA screening. Blood
 was drawn for cell-free fetal DNA screening today. We will
 communicate the results to the patient and scan a copy into
 [REDACTED].

 I reassured her that enlarged kidneys appears to be a normal
 variant without any pathology. However, we will reevaluate in
 8 weeks.
Recommendations

 -An appointment was made for her to return in 4 weeks for
 fetal growth and renal assessments (and for reassurance
 because of CPC).
                 Delvecchio, Murali
